# Patient Record
Sex: Female | Born: 1940 | Race: White | Hispanic: No | Marital: Married | Smoking: Never smoker
Health system: Southern US, Community
[De-identification: ages and names within clinical notes are randomized; demographics above are authoritative.]

## PROBLEM LIST (undated history)

## (undated) DIAGNOSIS — J189 Pneumonia, unspecified organism: Secondary | ICD-10-CM

## (undated) DIAGNOSIS — I10 Essential (primary) hypertension: Secondary | ICD-10-CM

## (undated) DIAGNOSIS — K219 Gastro-esophageal reflux disease without esophagitis: Secondary | ICD-10-CM

## (undated) DIAGNOSIS — E119 Type 2 diabetes mellitus without complications: Secondary | ICD-10-CM

## (undated) DIAGNOSIS — D649 Anemia, unspecified: Secondary | ICD-10-CM

## (undated) DIAGNOSIS — Z87440 Personal history of urinary (tract) infections: Secondary | ICD-10-CM

## (undated) DIAGNOSIS — Z9289 Personal history of other medical treatment: Secondary | ICD-10-CM

## (undated) DIAGNOSIS — E785 Hyperlipidemia, unspecified: Secondary | ICD-10-CM

## (undated) DIAGNOSIS — I509 Heart failure, unspecified: Secondary | ICD-10-CM

## (undated) HISTORY — DX: Gastro-esophageal reflux disease without esophagitis: K21.9

## (undated) HISTORY — DX: Pneumonia, unspecified organism: J18.9

## (undated) HISTORY — DX: Personal history of urinary (tract) infections: Z87.440

## (undated) HISTORY — DX: Personal history of other medical treatment: Z92.89

## (undated) HISTORY — PX: TONSILLECTOMY: SUR1361

---
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
## 2011-09-20 HISTORY — PX: CATARACT EXTRACTION, BILATERAL: SHX1313

## 2015-10-26 DIAGNOSIS — I2510 Atherosclerotic heart disease of native coronary artery without angina pectoris: Secondary | ICD-10-CM

## 2015-10-26 DIAGNOSIS — K219 Gastro-esophageal reflux disease without esophagitis: Secondary | ICD-10-CM

## 2015-10-26 HISTORY — DX: Gastro-esophageal reflux disease without esophagitis: K21.9

## 2015-10-26 HISTORY — DX: Atherosclerotic heart disease of native coronary artery without angina pectoris: I25.10

## 2017-06-23 DIAGNOSIS — I447 Left bundle-branch block, unspecified: Secondary | ICD-10-CM | POA: Insufficient documentation

## 2017-06-23 HISTORY — DX: Left bundle-branch block, unspecified: I44.7

## 2017-07-19 DIAGNOSIS — E1122 Type 2 diabetes mellitus with diabetic chronic kidney disease: Secondary | ICD-10-CM | POA: Insufficient documentation

## 2017-07-19 DIAGNOSIS — N183 Chronic kidney disease, stage 3 unspecified: Secondary | ICD-10-CM

## 2017-07-19 HISTORY — DX: Hypertensive chronic kidney disease with stage 1 through stage 4 chronic kidney disease, or unspecified chronic kidney disease: E11.22

## 2017-07-19 HISTORY — DX: Chronic kidney disease, stage 3 unspecified: N18.30

## 2017-09-26 DIAGNOSIS — G250 Essential tremor: Secondary | ICD-10-CM | POA: Insufficient documentation

## 2017-09-26 HISTORY — DX: Essential tremor: G25.0

## 2017-10-24 DIAGNOSIS — D329 Benign neoplasm of meninges, unspecified: Secondary | ICD-10-CM

## 2017-10-24 HISTORY — DX: Benign neoplasm of meninges, unspecified: D32.9

## 2020-07-25 ENCOUNTER — Emergency Department (HOSPITAL_COMMUNITY): Payer: Medicare Other

## 2020-07-25 ENCOUNTER — Emergency Department (HOSPITAL_COMMUNITY)
Admission: EM | Admit: 2020-07-25 | Discharge: 2020-07-25 | Disposition: A | Discharge status: Home / Self Care | Payer: Medicare Other | Attending: Emergency Medicine | Admitting: Emergency Medicine

## 2020-07-25 ENCOUNTER — Other Ambulatory Visit: Payer: Self-pay

## 2020-07-25 ENCOUNTER — Encounter (HOSPITAL_COMMUNITY): Payer: Self-pay

## 2020-07-25 DIAGNOSIS — N390 Urinary tract infection, site not specified: Secondary | ICD-10-CM | POA: Insufficient documentation

## 2020-07-25 DIAGNOSIS — F329 Major depressive disorder, single episode, unspecified: Secondary | ICD-10-CM | POA: Insufficient documentation

## 2020-07-25 DIAGNOSIS — I110 Hypertensive heart disease with heart failure: Secondary | ICD-10-CM | POA: Diagnosis not present

## 2020-07-25 DIAGNOSIS — E119 Type 2 diabetes mellitus without complications: Secondary | ICD-10-CM | POA: Diagnosis not present

## 2020-07-25 DIAGNOSIS — R1084 Generalized abdominal pain: Secondary | ICD-10-CM

## 2020-07-25 DIAGNOSIS — R000 Tachycardia, unspecified: Secondary | ICD-10-CM | POA: Insufficient documentation

## 2020-07-25 DIAGNOSIS — I509 Heart failure, unspecified: Secondary | ICD-10-CM | POA: Insufficient documentation

## 2020-07-25 DIAGNOSIS — K5641 Fecal impaction: Secondary | ICD-10-CM | POA: Diagnosis not present

## 2020-07-25 DIAGNOSIS — Z79899 Other long term (current) drug therapy: Secondary | ICD-10-CM | POA: Diagnosis not present

## 2020-07-25 DIAGNOSIS — Z20822 Contact with and (suspected) exposure to covid-19: Secondary | ICD-10-CM | POA: Diagnosis not present

## 2020-07-25 DIAGNOSIS — Z7982 Long term (current) use of aspirin: Secondary | ICD-10-CM | POA: Diagnosis not present

## 2020-07-25 DIAGNOSIS — Z7984 Long term (current) use of oral hypoglycemic drugs: Secondary | ICD-10-CM | POA: Diagnosis not present

## 2020-07-25 DIAGNOSIS — K5900 Constipation, unspecified: Secondary | ICD-10-CM | POA: Diagnosis present

## 2020-07-25 HISTORY — DX: Hyperlipidemia, unspecified: E78.5

## 2020-07-25 HISTORY — DX: Essential (primary) hypertension: I10

## 2020-07-25 HISTORY — DX: Anemia, unspecified: D64.9

## 2020-07-25 HISTORY — DX: Type 2 diabetes mellitus without complications: E11.9

## 2020-07-25 HISTORY — DX: Gastro-esophageal reflux disease without esophagitis: K21.9

## 2020-07-25 LAB — CBC WITH DIFFERENTIAL/PLATELET
Abs Immature Granulocytes: 0.11 10*3/uL — ABNORMAL HIGH (ref 0.00–0.07)
Basophils Absolute: 0.1 10*3/uL (ref 0.0–0.1)
Basophils Relative: 0 %
Eosinophils Absolute: 0 10*3/uL (ref 0.0–0.5)
Eosinophils Relative: 0 %
HCT: 42 % (ref 36.0–46.0)
Hemoglobin: 13.7 g/dL (ref 12.0–15.0)
Immature Granulocytes: 1 %
Lymphocytes Relative: 9 %
Lymphs Abs: 1.5 10*3/uL (ref 0.7–4.0)
MCH: 30.5 pg (ref 26.0–34.0)
MCHC: 32.6 g/dL (ref 30.0–36.0)
MCV: 93.5 fL (ref 80.0–100.0)
Monocytes Absolute: 0.8 10*3/uL (ref 0.1–1.0)
Monocytes Relative: 5 %
Neutro Abs: 14.7 10*3/uL — ABNORMAL HIGH (ref 1.7–7.7)
Neutrophils Relative %: 85 %
Platelets: 320 10*3/uL (ref 150–400)
RBC: 4.49 MIL/uL (ref 3.87–5.11)
RDW: 12.8 % (ref 11.5–15.5)
WBC: 17.3 10*3/uL — ABNORMAL HIGH (ref 4.0–10.5)
nRBC: 0 % (ref 0.0–0.2)

## 2020-07-25 LAB — URINALYSIS, ROUTINE W REFLEX MICROSCOPIC
Bilirubin Urine: NEGATIVE
Glucose, UA: >=500 mg/dL — AB
Hgb urine dipstick: NEGATIVE
Ketones, ur: 5 mg/dL — AB
Nitrite: POSITIVE — AB
Protein, ur: 30 mg/dL — AB
Specific Gravity, Urine: 1.015 (ref 1.005–1.030)
WBC, UA: >50 WBC/hpf — ABNORMAL HIGH (ref 0–5)
pH: 5 (ref 5.0–8.0)

## 2020-07-25 LAB — RESP PANEL BY RT PCR (RSV, FLU A&B, COVID)
Influenza A by PCR: NEGATIVE
Influenza B by PCR: NEGATIVE
Respiratory Syncytial Virus by PCR: NEGATIVE
SARS Coronavirus 2 by RT PCR: NEGATIVE

## 2020-07-25 LAB — COMPREHENSIVE METABOLIC PANEL
ALT: 20 U/L (ref 0–44)
AST: 20 U/L (ref 15–41)
Albumin: 4 g/dL (ref 3.5–5.0)
Alkaline Phosphatase: 75 U/L (ref 38–126)
Anion gap: 13 (ref 5–15)
BUN: 23 mg/dL (ref 8–23)
CO2: 20 mmol/L — ABNORMAL LOW (ref 22–32)
Calcium: 9.7 mg/dL (ref 8.9–10.3)
Chloride: 101 mmol/L (ref 98–111)
Creatinine, Ser: 1.24 mg/dL — ABNORMAL HIGH (ref 0.44–1.00)
GFR, Estimated: 45 mL/min — ABNORMAL LOW (ref >60)
Glucose, Bld: 381 mg/dL — ABNORMAL HIGH (ref 70–99)
Potassium: 5.1 mmol/L (ref 3.5–5.1)
Sodium: 134 mmol/L — ABNORMAL LOW (ref 135–145)
Total Bilirubin: 0.8 mg/dL (ref 0.3–1.2)
Total Protein: 8.1 g/dL (ref 6.5–8.1)

## 2020-07-25 LAB — LIPASE, BLOOD: Lipase: 90 U/L — ABNORMAL HIGH (ref 11–51)

## 2020-07-25 LAB — BRAIN NATRIURETIC PEPTIDE: B Natriuretic Peptide: 61 pg/mL (ref 0.0–100.0)

## 2020-07-25 MED ORDER — CEPHALEXIN 500 MG PO CAPS: 500.0000 mg | ORAL_CAPSULE | Freq: Two times a day (BID) | ORAL | 0 refills | Status: AC

## 2020-07-25 MED ORDER — SENNOSIDES-DOCUSATE SODIUM 8.6-50 MG PO TABS: 2.0000 | ORAL_TABLET | Freq: Every day | ORAL | 0 refills | Status: AC

## 2020-07-25 MED ORDER — MAGNESIUM CITRATE PO SOLN: 1.0000 | Freq: Once | ORAL | 5 refills | Status: AC

## 2020-07-25 MED ORDER — CEPHALEXIN 500 MG PO CAPS: 500.0000 mg | ORAL_CAPSULE | Freq: Two times a day (BID) | ORAL | 0 refills | Status: DC

## 2020-07-25 MED ORDER — SENNOSIDES-DOCUSATE SODIUM 8.6-50 MG PO TABS: 2.0000 | ORAL_TABLET | Freq: Every day | ORAL | 0 refills | Status: DC

## 2020-07-25 MED ORDER — SODIUM CHLORIDE 0.9 % IV SOLN
1.0000 g | Freq: Once | INTRAVENOUS | Status: AC
  Administered 2020-07-25: 1 g via INTRAVENOUS
  Filled 2020-07-25: qty 10

## 2020-07-25 MED ORDER — ACETAMINOPHEN 500 MG PO TABS
1000.0000 mg | ORAL_TABLET | Freq: Once | ORAL | Status: AC
  Administered 2020-07-25: 1000 mg via ORAL
  Filled 2020-07-25: qty 2

## 2020-07-25 MED ORDER — MAGNESIUM CITRATE PO SOLN: 1.0000 | Freq: Once | ORAL | 5 refills | Status: DC

## 2020-07-25 MED ORDER — FENTANYL CITRATE (PF) 100 MCG/2ML IJ SOLN
25.0000 ug | Freq: Once | INTRAMUSCULAR | Status: AC
  Administered 2020-07-25: 25 ug via INTRAVENOUS
  Filled 2020-07-25: qty 2

## 2020-07-25 MED ORDER — IOHEXOL 300 MG/ML  SOLN
80.0000 mL | Freq: Once | INTRAMUSCULAR | Status: AC | PRN
  Administered 2020-07-25: 80 mL via INTRAVENOUS

## 2020-07-25 MED ORDER — SODIUM CHLORIDE 0.9 % IV SOLN: INTRAVENOUS | Status: DC

## 2020-07-25 NOTE — Discharge Instructions (Signed)
As discussed, your evaluation today has been largely reassuring.  But, it is important that you monitor your condition carefully, and do not hesitate to return to the ED if you develop new, or concerning changes in your condition. ? ?Otherwise, please follow-up with your physician for appropriate ongoing care. ? ?

## 2020-07-25 NOTE — ED Triage Notes (Signed)
Pt arrived via walk in, c/o constipation and diffuse abd pain x4 days. Denies any vomiting.   Took miralax x1 at home with no relief.

## 2020-07-25 NOTE — ED Notes (Signed)
Pt. Wishes to stay on chair due to nausea. Pt doesn't want to change into a gown.

## 2020-07-25 NOTE — ED Provider Notes (Signed)
[facility] DEPT Provider Note   CSN: [id number] Arrival date & time: 07/25/20  1559     History Chief Complaint  Patient presents with  . Constipation    Elke Holtry is a 79 y.o. female.  HPI Patient presents possibly with concern for constipation, abdominal pain.  Patient is a poor historian, when asked specific questions about how long she has had symptoms she responds" I do not know".  She has a similar answer to whether she has had similar episodes, when her last bowel movement was, if there are other ongoing concerns.  It seems as though patient has been symptomatic for a few days, with increasing, intermittent abdominal pain that is worse with sitting upright.  Does not seem as though she has had vomiting.  Patient seemingly denies abdominal surgery.  She notes that she is" going through a lot" but does not expand on what specifically she is referring to.  Additional history obtained on chart review is clear the patient goes to [facility] for the majority of her care, has had episodes with fecal incontinence, urinary incontinence within the past year.  Attempts at additional chart review from [facility] do not include visible EKG.  Patient does seemingly have history of sepsis, earlier in the year.  Past Medical History:  Diagnosis Date  . Anemia   . CHF (congestive heart failure) ([place])   . Diabetes mellitus without complication ([place])   . GERD (gastroesophageal reflux disease)   . Hyperlipidemia   . Hypertension     There are no problems to display for this patient.   History reviewed. No pertinent surgical history.   OB History   No obstetric history on file.     History reviewed. No pertinent family history.  Social History   Tobacco Use  . Smoking status: Never Smoker  . Smokeless tobacco: Never Used  Substance Use Topics  . Alcohol use: Not on file  . Drug use: Not on file    Home Medications Prior to Admission  medications   Medication Sig Start Date End Date Taking? Authorizing Provider  aspirin 325 MG tablet Take 325 mg by mouth daily.    Yes [provider]  atorvastatin (LIPITOR) 20 MG tablet Take 20 mg by mouth daily.  05/08/17  Yes [provider]  DETROL LA 4 MG 24 hr capsule Take 4 mg by mouth daily. 05/25/20  Yes [provider]  glimepiride (AMARYL) 4 MG tablet Take 8 mg by mouth daily with breakfast.  01/14/20  Yes [provider]  lisinopril (ZESTRIL) 10 MG tablet Take 10 mg by mouth daily. 04/27/20  Yes [provider]  Metamucil Fiber CHEW Chew 2 tablets by mouth daily.   Yes [provider]  polyethylene glycol (MIRALAX / GLYCOLAX) 17 g packet Take 17 g by mouth daily as needed for moderate constipation.   Yes [provider]    Allergies    Penicillins  Review of Systems   Review of Systems  Unable to perform ROS: Other  Confusion, patient answers" I do not know" to essentially all questions, including duration of her symptoms, as well as other ongoing complaints. Physical Exam Updated Vital Signs BP (!) 143/64 (BP Location: Right Arm)   Pulse (!) 108   Temp 97.6 F (36.4 C) (Oral)   Resp 19   Ht 5\' 2"  (1.575 m)   Wt 99.8 kg   SpO2 94%   BMI 40.24 kg/m   Physical  Exam Vitals and nursing note reviewed.  Constitutional:      Appearance: She is well-developed. She is obese. She is ill-appearing.  HENT:     Head: Normocephalic and atraumatic.  Eyes:     Conjunctiva/sclera: Conjunctivae normal.  Cardiovascular:     Rate and Rhythm: Regular rhythm. Tachycardia present.  Pulmonary:     Effort: Pulmonary effort is normal. No respiratory distress.     Breath sounds: Normal breath sounds. No stridor.  Abdominal:     General: There is no distension.     Tenderness: There is abdominal tenderness.  Skin:    General: Skin is warm and dry.  Neurological:     Mental Status: She is alert and oriented to person, place,  and time.     Cranial Nerves: No cranial nerve deficit.  Psychiatric:        Mood and Affect: Mood is depressed.        Behavior: Behavior is slowed and withdrawn.        Cognition and Memory: Cognition is impaired.     ED Results / Procedures / Treatments   Labs (all labs ordered are listed, but only abnormal results are displayed) Labs Reviewed  COMPREHENSIVE METABOLIC PANEL - Abnormal; Notable for the following components:      Result Value   Sodium 134 (*)    CO2 20 (*)    Glucose, Bld 381 (*)    Creatinine, Ser 1.24 (*)    GFR, Estimated 45 (*)    All other components within normal limits  LIPASE, BLOOD - Abnormal; Notable for the following components:   Lipase 90 (*)    All other components within normal limits  CBC WITH DIFFERENTIAL/PLATELET - Abnormal; Notable for the following components:   WBC 17.3 (*)    Neutro Abs 14.7 (*)    Abs Immature Granulocytes 0.11 (*)    All other components within normal limits  URINALYSIS, ROUTINE W REFLEX MICROSCOPIC - Abnormal; Notable for the following components:   APPearance HAZY (*)    Glucose, UA >=500 (*)    Ketones, ur 5 (*)    Protein, ur 30 (*)    Nitrite POSITIVE (*)    Leukocytes,Ua LARGE (*)    WBC, UA >50 (*)    Bacteria, UA RARE (*)    All other components within normal limits  RESP PANEL BY RT PCR (RSV, FLU A&B, COVID)  BRAIN NATRIURETIC PEPTIDE    EKG EKG Interpretation  Date/Time:  Saturday July 25 2020 17:00:53 EDT Ventricular Rate:  121 PR Interval:    QRS Duration: 120 QT Interval:  319 QTC Calculation: 453 R Axis:   -50 Text Interpretation: Sinus tachycardia Nonspecific IVCD with LAD Isttw Abnormal ECG Confirmed by Carmin Muskrat [phone]) on 07/25/2020 5:07:02 PM  Heart rate monitor sinus tach, 120, abnormal   Radiology CT ABDOMEN PELVIS W CONTRAST  Result Date: 07/25/2020 CLINICAL DATA:  Abdominal distension.  Diverticulitis suspected. EXAM: CT ABDOMEN AND PELVIS WITH CONTRAST TECHNIQUE:  Multidetector CT imaging of the abdomen and pelvis was performed using the standard protocol following bolus administration of intravenous contrast. CONTRAST:  75mL OMNIPAQUE IOHEXOL 300 MG/ML  SOLN COMPARISON:  CT 06/22/2017 FINDINGS: Lower chest: Minimal scarring in the periphery of the left lower lobe. Heart is upper normal in size. Mild wall thickening of the distal esophagus. Hepatobiliary: Stable 3.7 cm cyst in the left lobe of the liver. Smaller low-density lesion in the right lobe is too small to accurately characterize. Background hepatic  steatosis. Layering gallstones without pericholecystic inflammation. There is no biliary dilatation. Pancreas: No ductal dilatation or inflammation. Spleen: Normal in size without focal abnormality. Adrenals/Urinary Tract: Normal adrenal glands. No hydronephrosis. Mild chronic symmetric perinephric edema. No evidence of renal calculi. Homogeneous enhancement with symmetric excretion on delayed phase imaging. Subcentimeter low-density lesion in the right kidney not significantly changed from prior exam, too small to characterize but likely cyst. The urinary bladder is unremarkable. Stomach/Bowel: Mild distal esophageal wall thickening. The stomach is decompressed. There is no small bowel obstruction or inflammatory change. High-riding cecum in the right mid abdomen. The appendix is not confidently visualized, there is no appendicitis. Moderate stool in the right colon. Small volume of stool in the descending colon. Descending and sigmoid colonic diverticulosis. No diverticulitis. Stool ball distends the rectum with mild rectal wall thickening and perirectal fat stranding. Rectal distention of 6.5 cm. No bowel pneumatosis. Vascular/Lymphatic: Aortic atherosclerosis. No aortic aneurysm. Patent portal vein. No abdominopelvic adenopathy. Reproductive: Low-density in the endometrium may represent endometrial thickening or fluid, measuring 7 mm. There is no adnexal mass. Other:  No free air, free fluid, or intra-abdominal fluid collection. Tiny fat containing umbilical hernia. 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 Degenerative disc disease at T11-T12 affecting phenomenon and prominent Schmorl's node involving superior endplate of I20. There are no acute or suspicious osseous abnormalities. IMPRESSION: 1. Stool ball distends the rectum with mild rectal wall thickening and perirectal fat stranding, suspicious for fecal impaction. No perforation or bowel ischemia. 2. Low-density in the endometrium may represent endometrial thickening or fluid, measuring 7 mm. Recommend nonemergent pelvic ultrasound for characterization on an elective basis. 3. Colonic diverticulosis without diverticulitis. 4. Hepatic steatosis. Cholelithiasis without gallbladder inflammation. 5. Mild distal esophageal wall thickening, can be seen with reflux or esophagitis. Aortic Atherosclerosis (ICD10-I70.0). Electronically Signed   By: Keith Rake M.D.   On: 07/25/2020 18:50    Procedures Fecal disimpaction  Date/Time: 07/25/2020 10:09 PM Performed by: Carmin Muskrat, MD Authorized by: Carmin Muskrat, MD  Preparation: Patient was prepped and draped in the usual sterile fashion. Local anesthesia used: no  Anesthesia: Local anesthesia used: no  Sedation: Patient sedated: no  Patient tolerance: patient tolerated the procedure well with no immediate complications Comments: After fentanyl bolus the patient tolerated disimpaction with substantial amounts of stool removed from her rectum.    (including critical care time)  Medications Ordered in ED Medications  0.9 %  sodium chloride infusion ( Intravenous New Bag/Given (Non-Interop) 07/25/20 1704)  iohexol (OMNIPAQUE) 300 MG/ML solution 80 mL (80 mLs Intravenous Contrast Given 07/25/20 1808)  fentaNYL (SUBLIMAZE) injection 25 mcg (25 mcg Intravenous Given 07/25/20 2100)  acetaminophen (TYLENOL) tablet 1,000  mg (1,000 mg Oral Given 07/25/20 2115)  cefTRIAXone (ROCEPHIN) 1 g in sodium chloride 0.9 % 100 mL IVPB (0 g Intravenous Stopped 07/25/20 2154)    ED Course  I have reviewed the triage vital signs and the nursing notes.  Pertinent labs & imaging results that were available during my care of the patient were reviewed by me and considered in my medical decision making (see chart for details).  Update:, Patient accompanied by her husband.  On we discussed findings thus far including CT, labs, urinalysis.  Findings notable for CT suggesting fecal impaction, without obvious obstruction.  Urinalysis suggest infection, labs notable for mild dehydration, hyperkalemia and hyperglycemia.  Patient amenable to disimpaction. 10:08 PM Patient has completed ceftriaxone, has received Tylenol, fever has resolved, heart rate has improved substantially, she is  feeling substantially better, patient appropriate for discharge with outpatient follow-up and ongoing bowel habit regimen. Final Clinical Impression(s) / ED Diagnoses Final diagnoses:  Generalized abdominal pain  Fecal impaction in rectum ([place])  Lower urinary tract infectious disease   MDM Number of Diagnoses or Management Options Fecal impaction in rectum [facility]): new, needed workup Generalized abdominal pain: new, needed workup Lower urinary tract infectious disease: new, needed workup   Amount and/or Complexity of Data Reviewed Clinical lab tests: reviewed Tests in the radiology section of CPT: reviewed Tests in the medicine section of CPT: reviewed Decide to obtain previous medical records or to obtain history from someone other than the patient: yes Obtain history from someone other than the patient: yes Review and summarize past medical records: yes Independent visualization of images, tracings, or specimens: yes  Risk of Complications, Morbidity, and/or Mortality Presenting problems: high Diagnostic procedures: high Management options:  high  Critical Care Total time providing critical care: < 30 minutes  Patient Progress Patient progress: stable  Rx / DC Orders ED Discharge Orders         Ordered    magnesium citrate SOLN   Once        07/25/20 2211    senna-docusate (SENOKOT-S) 8.6-50 MG tablet  Daily        07/25/20 2211    cephALEXin (KEFLEX) 500 MG capsule  2 times daily        07/25/20 2211           Carmin Muskrat, MD 07/25/20 2212

## 2020-11-12 ENCOUNTER — Inpatient Hospital Stay (HOSPITAL_COMMUNITY)
Admission: EM | Admit: 2020-11-12 | Discharge: 2020-11-18 | DRG: 871 | Disposition: A | Discharge status: Home Health | Payer: Medicare Other | Attending: Family Medicine | Admitting: Family Medicine

## 2020-11-12 ENCOUNTER — Encounter (HOSPITAL_COMMUNITY): Payer: Self-pay | Admitting: Emergency Medicine

## 2020-11-12 DIAGNOSIS — K219 Gastro-esophageal reflux disease without esophagitis: Secondary | ICD-10-CM

## 2020-11-12 DIAGNOSIS — D320 Benign neoplasm of cerebral meninges: Secondary | ICD-10-CM | POA: Diagnosis present

## 2020-11-12 DIAGNOSIS — E1122 Type 2 diabetes mellitus with diabetic chronic kidney disease: Secondary | ICD-10-CM | POA: Diagnosis present

## 2020-11-12 DIAGNOSIS — Z880 Allergy status to penicillin: Secondary | ICD-10-CM

## 2020-11-12 DIAGNOSIS — A4150 Gram-negative sepsis, unspecified: Secondary | ICD-10-CM | POA: Diagnosis not present

## 2020-11-12 DIAGNOSIS — T464X6A Underdosing of angiotensin-converting-enzyme inhibitors, initial encounter: Secondary | ICD-10-CM | POA: Diagnosis present

## 2020-11-12 DIAGNOSIS — Z87440 Personal history of urinary (tract) infections: Secondary | ICD-10-CM

## 2020-11-12 DIAGNOSIS — Z6841 Body Mass Index (BMI) 40.0 and over, adult: Secondary | ICD-10-CM

## 2020-11-12 DIAGNOSIS — E872 Acidosis, unspecified: Secondary | ICD-10-CM | POA: Diagnosis present

## 2020-11-12 DIAGNOSIS — Z993 Dependence on wheelchair: Secondary | ICD-10-CM

## 2020-11-12 DIAGNOSIS — N390 Urinary tract infection, site not specified: Secondary | ICD-10-CM | POA: Diagnosis present

## 2020-11-12 DIAGNOSIS — Z20822 Contact with and (suspected) exposure to covid-19: Secondary | ICD-10-CM | POA: Diagnosis present

## 2020-11-12 DIAGNOSIS — I2510 Atherosclerotic heart disease of native coronary artery without angina pectoris: Secondary | ICD-10-CM

## 2020-11-12 DIAGNOSIS — N179 Acute kidney failure, unspecified: Secondary | ICD-10-CM

## 2020-11-12 DIAGNOSIS — E860 Dehydration: Secondary | ICD-10-CM | POA: Diagnosis present

## 2020-11-12 DIAGNOSIS — R739 Hyperglycemia, unspecified: Secondary | ICD-10-CM

## 2020-11-12 DIAGNOSIS — R6520 Severe sepsis without septic shock: Secondary | ICD-10-CM | POA: Diagnosis present

## 2020-11-12 DIAGNOSIS — F0390 Unspecified dementia without behavioral disturbance: Secondary | ICD-10-CM | POA: Diagnosis present

## 2020-11-12 DIAGNOSIS — I21A1 Myocardial infarction type 2: Secondary | ICD-10-CM | POA: Diagnosis present

## 2020-11-12 DIAGNOSIS — T443X6A Underdosing of other parasympatholytics [anticholinergics and antimuscarinics] and spasmolytics, initial encounter: Secondary | ICD-10-CM | POA: Diagnosis present

## 2020-11-12 DIAGNOSIS — E876 Hypokalemia: Secondary | ICD-10-CM | POA: Diagnosis present

## 2020-11-12 DIAGNOSIS — E782 Mixed hyperlipidemia: Secondary | ICD-10-CM | POA: Diagnosis present

## 2020-11-12 DIAGNOSIS — I472 Ventricular tachycardia: Secondary | ICD-10-CM

## 2020-11-12 DIAGNOSIS — R6510 Systemic inflammatory response syndrome (SIRS) of non-infectious origin without acute organ dysfunction: Secondary | ICD-10-CM | POA: Diagnosis not present

## 2020-11-12 DIAGNOSIS — G928 Other toxic encephalopathy: Secondary | ICD-10-CM | POA: Diagnosis present

## 2020-11-12 DIAGNOSIS — R000 Tachycardia, unspecified: Secondary | ICD-10-CM

## 2020-11-12 DIAGNOSIS — E1169 Type 2 diabetes mellitus with other specified complication: Secondary | ICD-10-CM | POA: Diagnosis present

## 2020-11-12 DIAGNOSIS — Z91138 Patient's unintentional underdosing of medication regimen for other reason: Secondary | ICD-10-CM

## 2020-11-12 DIAGNOSIS — K56609 Unspecified intestinal obstruction, unspecified as to partial versus complete obstruction: Secondary | ICD-10-CM

## 2020-11-12 DIAGNOSIS — L89316 Pressure-induced deep tissue damage of right buttock: Secondary | ICD-10-CM | POA: Diagnosis present

## 2020-11-12 DIAGNOSIS — L89326 Pressure-induced deep tissue damage of left buttock: Secondary | ICD-10-CM | POA: Diagnosis present

## 2020-11-12 DIAGNOSIS — K8020 Calculus of gallbladder without cholecystitis without obstruction: Secondary | ICD-10-CM | POA: Diagnosis present

## 2020-11-12 DIAGNOSIS — Z79899 Other long term (current) drug therapy: Secondary | ICD-10-CM

## 2020-11-12 DIAGNOSIS — Z955 Presence of coronary angioplasty implant and graft: Secondary | ICD-10-CM

## 2020-11-12 DIAGNOSIS — I471 Supraventricular tachycardia: Secondary | ICD-10-CM | POA: Diagnosis present

## 2020-11-12 DIAGNOSIS — F22 Delusional disorders: Secondary | ICD-10-CM

## 2020-11-12 DIAGNOSIS — I129 Hypertensive chronic kidney disease with stage 1 through stage 4 chronic kidney disease, or unspecified chronic kidney disease: Secondary | ICD-10-CM | POA: Diagnosis present

## 2020-11-12 DIAGNOSIS — R079 Chest pain, unspecified: Secondary | ICD-10-CM

## 2020-11-12 DIAGNOSIS — N184 Chronic kidney disease, stage 4 (severe): Secondary | ICD-10-CM | POA: Diagnosis present

## 2020-11-12 DIAGNOSIS — Z888 Allergy status to other drugs, medicaments and biological substances status: Secondary | ICD-10-CM

## 2020-11-12 DIAGNOSIS — T466X6A Underdosing of antihyperlipidemic and antiarteriosclerotic drugs, initial encounter: Secondary | ICD-10-CM | POA: Diagnosis present

## 2020-11-12 DIAGNOSIS — T383X6A Underdosing of insulin and oral hypoglycemic [antidiabetic] drugs, initial encounter: Secondary | ICD-10-CM | POA: Diagnosis present

## 2020-11-12 DIAGNOSIS — Z8679 Personal history of other diseases of the circulatory system: Secondary | ICD-10-CM

## 2020-11-12 DIAGNOSIS — I10 Essential (primary) hypertension: Secondary | ICD-10-CM | POA: Diagnosis present

## 2020-11-12 DIAGNOSIS — A419 Sepsis, unspecified organism: Secondary | ICD-10-CM

## 2020-11-12 DIAGNOSIS — E871 Hypo-osmolality and hyponatremia: Secondary | ICD-10-CM | POA: Diagnosis present

## 2020-11-12 DIAGNOSIS — E1165 Type 2 diabetes mellitus with hyperglycemia: Secondary | ICD-10-CM | POA: Diagnosis present

## 2020-11-12 DIAGNOSIS — I447 Left bundle-branch block, unspecified: Secondary | ICD-10-CM | POA: Diagnosis present

## 2020-11-12 LAB — CBC
HCT: 41.6 % (ref 36.0–46.0)
Hemoglobin: 13.5 g/dL (ref 12.0–15.0)
MCH: 29.7 pg (ref 26.0–34.0)
MCHC: 32.5 g/dL (ref 30.0–36.0)
MCV: 91.6 fL (ref 80.0–100.0)
Platelets: 247 10*3/uL (ref 150–400)
RBC: 4.54 MIL/uL (ref 3.87–5.11)
RDW: 12.4 % (ref 11.5–15.5)
WBC: 8.3 10*3/uL (ref 4.0–10.5)
nRBC: 0 % (ref 0.0–0.2)

## 2020-11-12 LAB — BASIC METABOLIC PANEL
Anion gap: 11 (ref 5–15)
BUN: 16 mg/dL (ref 8–23)
CO2: 23 mmol/L (ref 22–32)
Calcium: 9.5 mg/dL (ref 8.9–10.3)
Chloride: 99 mmol/L (ref 98–111)
Creatinine, Ser: 1.26 mg/dL — ABNORMAL HIGH (ref 0.44–1.00)
GFR, Estimated: 44 mL/min — ABNORMAL LOW (ref >60)
Glucose, Bld: 400 mg/dL — ABNORMAL HIGH (ref 70–99)
Potassium: 4 mmol/L (ref 3.5–5.1)
Sodium: 133 mmol/L — ABNORMAL LOW (ref 135–145)

## 2020-11-12 LAB — I-STAT VENOUS BLOOD GAS, ED
Acid-Base Excess: 1 mmol/L (ref 0.0–2.0)
Bicarbonate: 23.6 mmol/L (ref 20.0–28.0)
Calcium, Ion: 1.16 mmol/L (ref 1.15–1.40)
HCT: 40 % (ref 36.0–46.0)
Hemoglobin: 13.6 g/dL (ref 12.0–15.0)
O2 Saturation: 61 %
Potassium: 4.3 mmol/L (ref 3.5–5.1)
Sodium: 134 mmol/L — ABNORMAL LOW (ref 135–145)
TCO2: 25 mmol/L (ref 22–32)
pCO2, Ven: 30 mmHg — ABNORMAL LOW (ref 44.0–60.0)
pH, Ven: 7.503 — ABNORMAL HIGH (ref 7.250–7.430)
pO2, Ven: 28 mmHg — CL (ref 32.0–45.0)

## 2020-11-12 LAB — CBG MONITORING, ED
Glucose-Capillary: 305 mg/dL — ABNORMAL HIGH (ref 70–99)
Glucose-Capillary: 445 mg/dL — ABNORMAL HIGH (ref 70–99)

## 2020-11-12 LAB — URINALYSIS, ROUTINE W REFLEX MICROSCOPIC
Bilirubin Urine: NEGATIVE
Glucose, UA: >=500 mg/dL — AB
Hgb urine dipstick: NEGATIVE
Ketones, ur: 5 mg/dL — AB
Nitrite: NEGATIVE
Protein, ur: NEGATIVE mg/dL
Specific Gravity, Urine: 1.01 (ref 1.005–1.030)
pH: 7 (ref 5.0–8.0)

## 2020-11-12 MED ORDER — INSULIN ASPART 100 UNIT/ML ~~LOC~~ SOLN
0.0000 [IU] | Freq: Three times a day (TID) | SUBCUTANEOUS | Status: DC
  Administered 2020-11-14 (×2): 11 [IU] via SUBCUTANEOUS
  Administered 2020-11-14: 5 [IU] via SUBCUTANEOUS

## 2020-11-12 MED ORDER — LACTATED RINGERS IV BOLUS
1000.0000 mL | Freq: Once | INTRAVENOUS | Status: AC
  Administered 2020-11-12: 1000 mL via INTRAVENOUS

## 2020-11-12 MED ORDER — LISINOPRIL 10 MG PO TABS
10.0000 mg | ORAL_TABLET | Freq: Every day | ORAL | Status: DC
  Administered 2020-11-13: 10 mg via ORAL
  Filled 2020-11-12: qty 1

## 2020-11-12 MED ORDER — ATORVASTATIN CALCIUM 10 MG PO TABS
20.0000 mg | ORAL_TABLET | Freq: Every day | ORAL | Status: DC
  Administered 2020-11-13 – 2020-11-18 (×6): 20 mg via ORAL
  Filled 2020-11-12 (×6): qty 2

## 2020-11-12 NOTE — ED Provider Notes (Signed)
[facility] EMERGENCY DEPARTMENT Provider Note   CSN: [id number] Arrival date & time: 11/12/20  1754     History Chief Complaint  Patient presents with  . Weakness    Stefanie Houston is a 80 y.o. female w/ h/o CHF, T2DM, GERD, HLD, and HTN who presents to the ED via EMS form home with husband for hyperglycemia and psychiatric evaluation. EMS called to home today per county serviceman who was visiting the home for husband's concerns of psychiatric problem. Over the last several months, patient refusing to see her doctors or take medicine for her diabetes or other chronic conditions because she believes they are members of a cult and are trying to control her. Patient also believes her daughter is a member of a cult as well when she told the patient that she needed to see her doctor. Husband denies any SI or HI and feels safe with her at home, but is concerned because her delusions are interfering with treatment for her chronic health conditions. He wanted patient to come to ED for psychiatric evaluation. Currently not on any psychiatric medications nor is seen by counselor/psychiatrist. Husband also worried about UTI given her long history of urinary incontinence and previous UTI. Patient states she feels well and no complaints at this time.  The history is provided by the patient, medical records and the spouse.  Mental Health Problem Presenting symptoms: delusional   Presenting symptoms: no hallucinations, no self-mutilation and no suicidal thoughts   Patient accompanied by:  Caregiver (EMS) Degree of incapacity (severity):  Moderate Onset quality:  Gradual Duration: several months. Timing:  Constant Progression:  Unchanged Chronicity:  New Treatment compliance:  Untreated Relieved by:  Nothing Worsened by:  Family interactions (interactions with her doctors and daughter) Ineffective treatments:  None tried Associated symptoms: no abdominal pain, no chest pain and no  headaches   Risk factors: hx of mental illness   Risk factors: no hx of suicide attempts        Past Medical History:  Diagnosis Date  . Anemia   . CHF (congestive heart failure) ([place])   . Diabetes mellitus without complication ([place])   . GERD (gastroesophageal reflux disease)   . Hyperlipidemia   . Hypertension     There are no problems to display for this patient.   History reviewed. No pertinent surgical history.   OB History   No obstetric history on file.     No family history on file.  Social History   Tobacco Use  . Smoking status: Never Smoker  . Smokeless tobacco: Never Used    Home Medications Prior to Admission medications   Medication Sig Start Date End Date Taking? Authorizing Provider  aspirin 325 MG tablet Take 325 mg by mouth daily.     [provider]  atorvastatin (LIPITOR) 20 MG tablet Take 20 mg by mouth daily.  05/08/17   [provider]  DETROL LA 4 MG 24 hr capsule Take 4 mg by mouth daily. 05/25/20   [provider]  glimepiride (AMARYL) 4 MG tablet Take 8 mg by mouth daily with breakfast.  01/14/20   [provider]  lisinopril (ZESTRIL) 10 MG tablet Take 10 mg by mouth daily. 04/27/20   [provider]  Metamucil Fiber CHEW Chew 2 tablets by mouth daily.    [provider]  polyethylene glycol (MIRALAX / GLYCOLAX) 17 g packet Take 17 g by mouth daily as needed for moderate constipation.    [provider]    Allergies    Penicillins  Review of Systems   Review of Systems  Constitutional: Negative for chills and fever.  HENT: Negative for ear pain and sore throat.   Eyes: Negative for pain and visual disturbance.  Respiratory: Negative for cough and shortness of breath.   Cardiovascular: Negative for chest pain and palpitations.  Gastrointestinal: Negative for abdominal pain and vomiting.  Genitourinary: Negative for dysuria and hematuria.  Musculoskeletal: Negative for  arthralgias and back pain.  Skin: Negative for color change and rash.  Neurological: Negative for seizures, syncope and headaches.  Psychiatric/Behavioral: Negative for hallucinations, self-injury and suicidal ideas.  All other systems reviewed and are negative.   Physical Exam Updated Vital Signs BP (!) 157/71   Pulse 82   Temp 98.7 F (37.1 C) (Oral)   Resp 13   Ht 5\' 2"  (1.575 m)   Wt 99.8 kg   SpO2 100%   BMI 40.24 kg/m   Physical Exam Vitals and nursing note reviewed.  Constitutional:      General: She is not in acute distress.    Appearance: Normal appearance. She is well-developed and well-nourished. She is not ill-appearing.  HENT:     Head: Normocephalic and atraumatic.     Right Ear: External ear normal.     Left Ear: External ear normal.     Nose: Nose normal.     Mouth/Throat:     Mouth: Mucous membranes are moist.     Pharynx: Oropharynx is clear. No oropharyngeal exudate or posterior oropharyngeal erythema.  Eyes:     General: No scleral icterus.       Right eye: No discharge.        Left eye: No discharge.     Conjunctiva/sclera: Conjunctivae normal.  Cardiovascular:     Rate and Rhythm: Normal rate and regular rhythm.     Pulses: Normal pulses.     Heart sounds: Normal heart sounds. No murmur heard.   Pulmonary:     Effort: Pulmonary effort is normal. No respiratory distress.     Breath sounds: Normal breath sounds. No wheezing, rhonchi or rales.  Abdominal:     General: Abdomen is flat. There is no distension.     Palpations: Abdomen is soft.     Tenderness: There is no abdominal tenderness. There is no guarding or rebound.  Musculoskeletal:        General: No edema.     Cervical back: Neck supple.     Right lower leg: No edema.     Left lower leg: No edema.  Skin:    General: Skin is warm and dry.     Findings: No rash.  Neurological:     General: No focal deficit present.     Mental Status: She is alert and oriented to person, place, and  time.     Motor: No weakness.  Psychiatric:        Attention and Perception: Attention normal.        Mood and Affect: Mood and affect normal.        Speech: Speech normal.        Behavior: Behavior normal. Behavior is cooperative.        Thought Content: Thought content is paranoid and delusional. Thought content does not include homicidal or suicidal ideation. Thought content does not include homicidal or suicidal plan.     ED Results / Procedures / Treatments   Labs (all labs ordered are listed, but  only abnormal results are displayed) Labs Reviewed  BASIC METABOLIC PANEL - Abnormal; Notable for the following components:      Result Value   Sodium 133 (*)    Glucose, Bld 400 (*)    Creatinine, Ser 1.26 (*)    GFR, Estimated 44 (*)    All other components within normal limits  URINALYSIS, ROUTINE W REFLEX MICROSCOPIC - Abnormal; Notable for the following components:   Color, Urine STRAW (*)    Glucose, UA >=500 (*)    Ketones, ur 5 (*)    Leukocytes,Ua TRACE (*)    Bacteria, UA FEW (*)    All other components within normal limits  CBG MONITORING, ED - Abnormal; Notable for the following components:   Glucose-Capillary 445 (*)    All other components within normal limits  I-STAT VENOUS BLOOD GAS, ED - Abnormal; Notable for the following components:   pH, Ven 7.503 (*)    pCO2, Ven 30.0 (*)    pO2, Ven 28.0 (*)    Sodium 134 (*)    All other components within normal limits  CBC  CBG MONITORING, ED    EKG EKG Interpretation  Date/Time:  Thursday November 12 2020 18:07:18 EST Ventricular Rate:  84 PR Interval:    QRS Duration: 118 QT Interval:  386 QTC Calculation: 457 R Axis:   -42 Text Interpretation: Sinus rhythm Incomplete left bundle branch block Probable left ventricular hypertrophy Anterior Q waves, possibly due to LVH Confirmed by Madalyn Rob [phone]) on 11/12/2020 6:36:10 PM   Radiology No results found.  Procedures Procedures  Medications  Ordered in ED Medications  lactated ringers bolus 1,000 mL (0 mLs Intravenous Stopped 11/12/20 2126)  lactated ringers bolus 1,000 mL (1,000 mLs Intravenous Bolus 11/12/20 2135)    ED Course  I have reviewed the triage vital signs and the nursing notes.  Pertinent labs & imaging results that were available during my care of the patient were reviewed by me and considered in my medical decision making (see chart for details).    MDM Rules/Calculators/A&P                          Patient is a 49yoF with history and physical as described above who presents to the ED for paranoid delusions. VS reassuring and HDS. Patient resting comfortably, calm, cooperative, and does not appear to be responding to internal stimuli. Does not appear to be intoxication or acutely psychotic. No SI, HI, or AVH. Diagnostic workup demonstrated hyperglycemia without signs of DKA or HHS. UA not concerning for infection. ECG reassuring. Suspect hyperglycemia likely secondary to medication non-adherence. Presentation exhbiting paranoid delusions regarding her healthcare providers and treatment of her underlying medical conditions. Patient medically cleared for TTS for further psychiatric evaluation.  At time of sign out, awaiting TTS evaluation and recommendations. Dispo per TTS. Patient otherwise remained HDS with no acute events in ED. Spouse reliable caregiver and feels safe returning her home with him if she is discharged.  Transfer of care at 2315 to oncoming treatment team. Please refer to their note for further MDM. Patient is stable condition at time of transfer.  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

## 2020-11-12 NOTE — ED Triage Notes (Signed)
Patient BIB [facility] for evaluation of generalized weakness, decreased mobility, and "red sores" on legs that started several weeks ago. History of diabetes, does not take prescribed medication, CBG 469 with EMS. Patient alert, oriented, and in no apparent distress at this time.

## 2020-11-13 ENCOUNTER — Other Ambulatory Visit: Payer: Self-pay

## 2020-11-13 LAB — LIPID PANEL
Cholesterol: 197 mg/dL (ref 0–200)
HDL: 37 mg/dL — ABNORMAL LOW (ref >40)
LDL Cholesterol: 122 mg/dL — ABNORMAL HIGH (ref 0–99)
Total CHOL/HDL Ratio: 5.3 RATIO
Triglycerides: 190 mg/dL — ABNORMAL HIGH (ref <150)
VLDL: 38 mg/dL (ref 0–40)

## 2020-11-13 LAB — CBG MONITORING, ED
Glucose-Capillary: 331 mg/dL — ABNORMAL HIGH (ref 70–99)
Glucose-Capillary: 349 mg/dL — ABNORMAL HIGH (ref 70–99)
Glucose-Capillary: 323 mg/dL — ABNORMAL HIGH (ref 70–99)
Glucose-Capillary: 327 mg/dL — ABNORMAL HIGH (ref 70–99)

## 2020-11-13 LAB — BASIC METABOLIC PANEL
Anion gap: 12 (ref 5–15)
BUN: 19 mg/dL (ref 8–23)
CO2: 20 mmol/L — ABNORMAL LOW (ref 22–32)
Calcium: 8.9 mg/dL (ref 8.9–10.3)
Chloride: 99 mmol/L (ref 98–111)
Creatinine, Ser: 1.23 mg/dL — ABNORMAL HIGH (ref 0.44–1.00)
GFR, Estimated: 45 mL/min — ABNORMAL LOW (ref >60)
Glucose, Bld: 356 mg/dL — ABNORMAL HIGH (ref 70–99)
Potassium: 5.6 mmol/L — ABNORMAL HIGH (ref 3.5–5.1)
Sodium: 131 mmol/L — ABNORMAL LOW (ref 135–145)

## 2020-11-13 LAB — HEMOGLOBIN A1C
Hgb A1c MFr Bld: 14.5 % — ABNORMAL HIGH (ref 4.8–5.6)
Mean Plasma Glucose: 369.45 mg/dL

## 2020-11-13 MED ORDER — PSYLLIUM 95 % PO PACK
1.0000 | PACK | Freq: Every day | ORAL | Status: DC
  Administered 2020-11-13 – 2020-11-18 (×5): 1 via ORAL
  Filled 2020-11-13 (×6): qty 1

## 2020-11-13 MED ORDER — GLIMEPIRIDE 4 MG PO TABS
8.0000 mg | ORAL_TABLET | Freq: Once | ORAL | Status: AC
  Administered 2020-11-13: 8 mg via ORAL
  Filled 2020-11-13: qty 2

## 2020-11-13 MED ORDER — POLYETHYLENE GLYCOL 3350 17 G PO PACK: 17.0000 g | PACK | Freq: Every day | ORAL | Status: DC | PRN

## 2020-11-13 MED ORDER — ASPIRIN 325 MG PO TABS
325.0000 mg | ORAL_TABLET | Freq: Every day | ORAL | Status: DC
  Administered 2020-11-13 – 2020-11-15 (×3): 325 mg via ORAL
  Filled 2020-11-13 (×3): qty 1

## 2020-11-13 MED ORDER — METFORMIN HCL 500 MG PO TABS
500.0000 mg | ORAL_TABLET | Freq: Two times a day (BID) | ORAL | Status: DC
  Administered 2020-11-13 – 2020-11-14 (×3): 500 mg via ORAL
  Filled 2020-11-13 (×3): qty 1

## 2020-11-13 NOTE — ED Notes (Signed)
Dinner Tray Ordered @ 1711. 

## 2020-11-13 NOTE — ED Notes (Signed)
Patient pressed callbell crying that she was wet, patient tearful stating she "doesn't want to go through this again. I don't want to have to talk to the people again. I told them I needed a priest and they just left me here." Patient provided reassurance. Patient's brief changed and purewick replaced. Chaplain called per patient request.
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
## 2020-11-13 NOTE — BH Assessment (Signed)
Clinician informed RN Donnamarie Poag of patient being recommended for inpatient gero psych placement.  CSW to assist with placement.
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
## 2020-11-13 NOTE — ED Provider Notes (Signed)
Patient is hyperglycemic. Has SSI ordered but refused insulin because she told the nurse she's allergic. Hard to tell if this is true, I cannot find documentation in the chart that she has ever had insulin before. Husband is not able to be reached to verify.  I had given her Amaryl, which was on her medication list this morning.  Discussed with pharmacy who recommends at this point try Metformin 500 mg twice daily starting tonight given her age.  Continue to try and get in touch with husband about insulin and possible allergy as this would be best.   Sherwood Gambler, MD 11/13/20 1357

## 2020-11-13 NOTE — Progress Notes (Signed)
Pt meets inpatient criteria per Lindon Romp, FNP. Referral information has been sent to the following hospitals for review:  [facility]  [facility]       Disposition will continue to follow.   Audree Camel, MSW, LCSW, [place] Clinical Social Worker II Disposition CSW [phone]

## 2020-11-13 NOTE — Discharge Planning (Signed)
RNCM reached out to PCP office to inquire about insulin allergy.  PCP office does not have record of insulin allergy.  Information relayed to [facility]

## 2020-11-13 NOTE — Progress Notes (Signed)
Chaplain visited with patient at the request of nurse. Patient is confused and very stressed that her family is in some kind of cult and she wanted Chaplain to pray for protection for them from evil. Patient was very emotional. Wanted to make sure Chaplain believed in God. Chaplain prayed with patient and sat with her for a while. She seemed calmer and more relaxed.     11/13/20 0630  Clinical Encounter Type  Visited With Patient  Visit Type Initial  Referral From Nurse  Consult/Referral To Chaplain  Spiritual Encounters  Spiritual Needs Prayer;Emotional  Stress Factors  Patient Stress Factors Family relationships

## 2020-11-13 NOTE — ED Provider Notes (Signed)
[facility] recommended inpatient geripsych treatment.  Will need to await placement.   Fatima Blank, MD 11/13/20 [phone]

## 2020-11-14 ENCOUNTER — Emergency Department (HOSPITAL_COMMUNITY): Payer: Medicare Other

## 2020-11-14 ENCOUNTER — Encounter (HOSPITAL_COMMUNITY): Payer: Self-pay | Admitting: Internal Medicine

## 2020-11-14 ENCOUNTER — Inpatient Hospital Stay (HOSPITAL_COMMUNITY): Payer: Medicare Other

## 2020-11-14 DIAGNOSIS — Z20822 Contact with and (suspected) exposure to covid-19: Secondary | ICD-10-CM | POA: Diagnosis present

## 2020-11-14 DIAGNOSIS — D320 Benign neoplasm of cerebral meninges: Secondary | ICD-10-CM | POA: Diagnosis present

## 2020-11-14 DIAGNOSIS — E782 Mixed hyperlipidemia: Secondary | ICD-10-CM

## 2020-11-14 DIAGNOSIS — I472 Ventricular tachycardia: Secondary | ICD-10-CM

## 2020-11-14 DIAGNOSIS — E872 Acidosis, unspecified: Secondary | ICD-10-CM

## 2020-11-14 DIAGNOSIS — R000 Tachycardia, unspecified: Secondary | ICD-10-CM

## 2020-11-14 DIAGNOSIS — E1122 Type 2 diabetes mellitus with diabetic chronic kidney disease: Secondary | ICD-10-CM | POA: Diagnosis present

## 2020-11-14 DIAGNOSIS — E1165 Type 2 diabetes mellitus with hyperglycemia: Secondary | ICD-10-CM

## 2020-11-14 DIAGNOSIS — N390 Urinary tract infection, site not specified: Secondary | ICD-10-CM | POA: Diagnosis not present

## 2020-11-14 DIAGNOSIS — N1831 Chronic kidney disease, stage 3a: Secondary | ICD-10-CM

## 2020-11-14 DIAGNOSIS — I21A1 Myocardial infarction type 2: Secondary | ICD-10-CM | POA: Diagnosis present

## 2020-11-14 DIAGNOSIS — R6510 Systemic inflammatory response syndrome (SIRS) of non-infectious origin without acute organ dysfunction: Secondary | ICD-10-CM | POA: Diagnosis present

## 2020-11-14 DIAGNOSIS — E871 Hypo-osmolality and hyponatremia: Secondary | ICD-10-CM | POA: Diagnosis not present

## 2020-11-14 DIAGNOSIS — I447 Left bundle-branch block, unspecified: Secondary | ICD-10-CM | POA: Diagnosis present

## 2020-11-14 DIAGNOSIS — G928 Other toxic encephalopathy: Secondary | ICD-10-CM | POA: Diagnosis present

## 2020-11-14 DIAGNOSIS — R079 Chest pain, unspecified: Secondary | ICD-10-CM

## 2020-11-14 DIAGNOSIS — I25119 Atherosclerotic heart disease of native coronary artery with unspecified angina pectoris: Secondary | ICD-10-CM | POA: Diagnosis not present

## 2020-11-14 DIAGNOSIS — L89316 Pressure-induced deep tissue damage of right buttock: Secondary | ICD-10-CM | POA: Diagnosis present

## 2020-11-14 DIAGNOSIS — N184 Chronic kidney disease, stage 4 (severe): Secondary | ICD-10-CM | POA: Diagnosis present

## 2020-11-14 DIAGNOSIS — E860 Dehydration: Secondary | ICD-10-CM

## 2020-11-14 DIAGNOSIS — N179 Acute kidney failure, unspecified: Secondary | ICD-10-CM | POA: Insufficient documentation

## 2020-11-14 DIAGNOSIS — E1169 Type 2 diabetes mellitus with other specified complication: Secondary | ICD-10-CM | POA: Diagnosis not present

## 2020-11-14 DIAGNOSIS — I340 Nonrheumatic mitral (valve) insufficiency: Secondary | ICD-10-CM | POA: Diagnosis not present

## 2020-11-14 DIAGNOSIS — A4150 Gram-negative sepsis, unspecified: Secondary | ICD-10-CM | POA: Diagnosis not present

## 2020-11-14 DIAGNOSIS — I10 Essential (primary) hypertension: Secondary | ICD-10-CM

## 2020-11-14 DIAGNOSIS — F22 Delusional disorders: Principal | ICD-10-CM

## 2020-11-14 DIAGNOSIS — Z6841 Body Mass Index (BMI) 40.0 and over, adult: Secondary | ICD-10-CM | POA: Diagnosis not present

## 2020-11-14 DIAGNOSIS — K219 Gastro-esophageal reflux disease without esophagitis: Secondary | ICD-10-CM

## 2020-11-14 DIAGNOSIS — K8020 Calculus of gallbladder without cholecystitis without obstruction: Secondary | ICD-10-CM | POA: Diagnosis present

## 2020-11-14 DIAGNOSIS — L89326 Pressure-induced deep tissue damage of left buttock: Secondary | ICD-10-CM | POA: Diagnosis present

## 2020-11-14 DIAGNOSIS — I129 Hypertensive chronic kidney disease with stage 1 through stage 4 chronic kidney disease, or unspecified chronic kidney disease: Secondary | ICD-10-CM | POA: Diagnosis present

## 2020-11-14 DIAGNOSIS — I361 Nonrheumatic tricuspid (valve) insufficiency: Secondary | ICD-10-CM | POA: Diagnosis not present

## 2020-11-14 DIAGNOSIS — I214 Non-ST elevation (NSTEMI) myocardial infarction: Secondary | ICD-10-CM | POA: Diagnosis not present

## 2020-11-14 DIAGNOSIS — I471 Supraventricular tachycardia: Secondary | ICD-10-CM | POA: Diagnosis present

## 2020-11-14 DIAGNOSIS — F0390 Unspecified dementia without behavioral disturbance: Secondary | ICD-10-CM | POA: Diagnosis present

## 2020-11-14 DIAGNOSIS — E876 Hypokalemia: Secondary | ICD-10-CM | POA: Diagnosis present

## 2020-11-14 DIAGNOSIS — R6520 Severe sepsis without septic shock: Secondary | ICD-10-CM | POA: Diagnosis present

## 2020-11-14 HISTORY — DX: Type 2 diabetes mellitus with other specified complication: E11.69

## 2020-11-14 HISTORY — DX: Acute kidney failure, unspecified: N17.9

## 2020-11-14 HISTORY — DX: Chest pain, unspecified: R07.9

## 2020-11-14 HISTORY — DX: Delusional disorders: F22

## 2020-11-14 HISTORY — DX: Hypo-osmolality and hyponatremia: E87.1

## 2020-11-14 HISTORY — DX: Gram-negative sepsis, unspecified: N39.0

## 2020-11-14 HISTORY — DX: Acidosis, unspecified: E87.20

## 2020-11-14 HISTORY — DX: Hypomagnesemia: E83.42

## 2020-11-14 HISTORY — DX: Acidosis: E87.2

## 2020-11-14 HISTORY — DX: Ventricular tachycardia: I47.2

## 2020-11-14 HISTORY — DX: Gram-negative sepsis, unspecified: A41.50

## 2020-11-14 HISTORY — DX: Essential (primary) hypertension: I10

## 2020-11-14 HISTORY — DX: Dehydration: E86.0

## 2020-11-14 HISTORY — DX: Chronic kidney disease, stage 3a: N18.31

## 2020-11-14 HISTORY — DX: Type 2 diabetes mellitus with hyperglycemia: E11.65

## 2020-11-14 HISTORY — DX: Tachycardia, unspecified: R00.0

## 2020-11-14 LAB — BASIC METABOLIC PANEL
Anion gap: 13 (ref 5–15)
Anion gap: 13 (ref 5–15)
BUN: 23 mg/dL (ref 8–23)
BUN: 30 mg/dL — ABNORMAL HIGH (ref 8–23)
CO2: 20 mmol/L — ABNORMAL LOW (ref 22–32)
CO2: 22 mmol/L (ref 22–32)
Calcium: 8.6 mg/dL — ABNORMAL LOW (ref 8.9–10.3)
Calcium: 8.8 mg/dL — ABNORMAL LOW (ref 8.9–10.3)
Chloride: 100 mmol/L (ref 98–111)
Chloride: 98 mmol/L (ref 98–111)
Creatinine, Ser: 1.57 mg/dL — ABNORMAL HIGH (ref 0.44–1.00)
Creatinine, Ser: 2.45 mg/dL — ABNORMAL HIGH (ref 0.44–1.00)
GFR, Estimated: 34 mL/min — ABNORMAL LOW (ref >60)
GFR, Estimated: 20 mL/min — ABNORMAL LOW (ref >60)
Glucose, Bld: 365 mg/dL — ABNORMAL HIGH (ref 70–99)
Glucose, Bld: 254 mg/dL — ABNORMAL HIGH (ref 70–99)
Potassium: 3.6 mmol/L (ref 3.5–5.1)
Potassium: 3.4 mmol/L — ABNORMAL LOW (ref 3.5–5.1)
Sodium: 133 mmol/L — ABNORMAL LOW (ref 135–145)
Sodium: 133 mmol/L — ABNORMAL LOW (ref 135–145)

## 2020-11-14 LAB — URINALYSIS, ROUTINE W REFLEX MICROSCOPIC
Bilirubin Urine: NEGATIVE
Glucose, UA: >=500 mg/dL — AB
Ketones, ur: NEGATIVE mg/dL
Nitrite: NEGATIVE
Protein, ur: 100 mg/dL — AB
Specific Gravity, Urine: 1.037 — ABNORMAL HIGH (ref 1.005–1.030)
WBC, UA: >50 WBC/hpf — ABNORMAL HIGH (ref 0–5)
pH: 5 (ref 5.0–8.0)

## 2020-11-14 LAB — CBC
HCT: 43.1 % (ref 36.0–46.0)
Hemoglobin: 14 g/dL (ref 12.0–15.0)
MCH: 30.2 pg (ref 26.0–34.0)
MCHC: 32.5 g/dL (ref 30.0–36.0)
MCV: 92.9 fL (ref 80.0–100.0)
Platelets: 221 10*3/uL (ref 150–400)
RBC: 4.64 MIL/uL (ref 3.87–5.11)
RDW: 12.7 % (ref 11.5–15.5)
WBC: 14.5 10*3/uL — ABNORMAL HIGH (ref 4.0–10.5)
nRBC: 0 % (ref 0.0–0.2)

## 2020-11-14 LAB — LIPID PANEL
Cholesterol: 184 mg/dL (ref 0–200)
HDL: 33 mg/dL — ABNORMAL LOW (ref >40)
LDL Cholesterol: 115 mg/dL — ABNORMAL HIGH (ref 0–99)
Total CHOL/HDL Ratio: 5.6 RATIO
Triglycerides: 181 mg/dL — ABNORMAL HIGH (ref <150)
VLDL: 36 mg/dL (ref 0–40)

## 2020-11-14 LAB — RESP PANEL BY RT-PCR (FLU A&B, COVID) ARPGX2
Influenza A by PCR: NEGATIVE
Influenza B by PCR: NEGATIVE
SARS Coronavirus 2 by RT PCR: NEGATIVE

## 2020-11-14 LAB — CBG MONITORING, ED
Glucose-Capillary: 301 mg/dL — ABNORMAL HIGH (ref 70–99)
Glucose-Capillary: 331 mg/dL — ABNORMAL HIGH (ref 70–99)
Glucose-Capillary: 333 mg/dL — ABNORMAL HIGH (ref 70–99)
Glucose-Capillary: 234 mg/dL — ABNORMAL HIGH (ref 70–99)

## 2020-11-14 LAB — MAGNESIUM: Magnesium: 1.4 mg/dL — ABNORMAL LOW (ref 1.7–2.4)

## 2020-11-14 LAB — LACTIC ACID, PLASMA: Lactic Acid, Venous: 2.8 mmol/L (ref 0.5–1.9)

## 2020-11-14 MED ORDER — ASPIRIN 81 MG PO CHEW
324.0000 mg | CHEWABLE_TABLET | Freq: Once | ORAL | Status: AC
  Administered 2020-11-14: 324 mg via ORAL

## 2020-11-14 MED ORDER — ACETAMINOPHEN 325 MG PO TABS: 650.0000 mg | ORAL_TABLET | Freq: Four times a day (QID) | ORAL | Status: DC | PRN

## 2020-11-14 MED ORDER — INSULIN GLARGINE 100 UNIT/ML ~~LOC~~ SOLN
15.0000 [IU] | Freq: Every day | SUBCUTANEOUS | Status: DC
  Administered 2020-11-15 (×2): 15 [IU] via SUBCUTANEOUS
  Filled 2020-11-14 (×3): qty 0.15

## 2020-11-14 MED ORDER — ENOXAPARIN SODIUM 30 MG/0.3ML ~~LOC~~ SOLN
30.0000 mg | Freq: Every day | SUBCUTANEOUS | Status: DC
  Administered 2020-11-15 – 2020-11-17 (×3): 30 mg via SUBCUTANEOUS
  Filled 2020-11-14 (×3): qty 0.3

## 2020-11-14 MED ORDER — INSULIN ASPART 100 UNIT/ML ~~LOC~~ SOLN: 0.0000 [IU] | Freq: Three times a day (TID) | SUBCUTANEOUS | Status: DC

## 2020-11-14 MED ORDER — SODIUM CHLORIDE 0.9 % IV SOLN
1.0000 g | INTRAVENOUS | Status: DC
  Administered 2020-11-15 – 2020-11-17 (×4): 1 g via INTRAVENOUS
  Filled 2020-11-14: qty 1
  Filled 2020-11-14: qty 10
  Filled 2020-11-14 (×3): qty 1

## 2020-11-14 MED ORDER — PANTOPRAZOLE SODIUM 40 MG PO TBEC
40.0000 mg | DELAYED_RELEASE_TABLET | Freq: Every day | ORAL | Status: DC
  Administered 2020-11-15 – 2020-11-18 (×4): 40 mg via ORAL
  Filled 2020-11-14 (×4): qty 1

## 2020-11-14 MED ORDER — POTASSIUM CHLORIDE 2 MEQ/ML IV SOLN
INTRAVENOUS | Status: AC
  Filled 2020-11-14 (×2): qty 1000

## 2020-11-14 MED ORDER — INSULIN ASPART 100 UNIT/ML ~~LOC~~ SOLN
4.0000 [IU] | Freq: Three times a day (TID) | SUBCUTANEOUS | Status: DC
  Administered 2020-11-16 – 2020-11-18 (×7): 4 [IU] via SUBCUTANEOUS

## 2020-11-14 MED ORDER — INSULIN ASPART 100 UNIT/ML ~~LOC~~ SOLN
0.0000 [IU] | Freq: Three times a day (TID) | SUBCUTANEOUS | Status: DC
  Administered 2020-11-15: 11 [IU] via SUBCUTANEOUS
  Administered 2020-11-15: 15 [IU] via SUBCUTANEOUS
  Administered 2020-11-15: 8 [IU] via SUBCUTANEOUS
  Administered 2020-11-16 (×3): 3 [IU] via SUBCUTANEOUS
  Administered 2020-11-17 (×2): 2 [IU] via SUBCUTANEOUS
  Administered 2020-11-17 – 2020-11-18 (×2): 3 [IU] via SUBCUTANEOUS
  Administered 2020-11-18: 8 [IU] via SUBCUTANEOUS

## 2020-11-14 MED ORDER — POTASSIUM CHLORIDE CRYS ER 20 MEQ PO TBCR: 40.0000 meq | EXTENDED_RELEASE_TABLET | Freq: Once | ORAL | Status: DC

## 2020-11-14 MED ORDER — ADENOSINE 6 MG/2ML IV SOLN
6.0000 mg | Freq: Once | INTRAVENOUS | Status: DC
  Filled 2020-11-14: qty 2

## 2020-11-14 MED ORDER — MAGNESIUM SULFATE 50 % IJ SOLN
3.0000 g | Freq: Once | INTRAVENOUS | Status: AC
  Administered 2020-11-15: 3 g via INTRAVENOUS
  Filled 2020-11-14: qty 6

## 2020-11-14 MED ORDER — SODIUM CHLORIDE 0.9 % IV SOLN
1000.0000 mL | INTRAVENOUS | Status: DC
  Administered 2020-11-14: 1000 mL via INTRAVENOUS

## 2020-11-14 MED ORDER — SODIUM CHLORIDE 0.9 % IV BOLUS (SEPSIS)
1000.0000 mL | Freq: Once | INTRAVENOUS | Status: AC
  Administered 2020-11-14: 1000 mL via INTRAVENOUS

## 2020-11-14 MED ORDER — POTASSIUM CHLORIDE 2 MEQ/ML IV SOLN: INTRAVENOUS | Status: DC

## 2020-11-14 MED ORDER — LACTATED RINGERS IV SOLN: INTRAVENOUS | Status: DC

## 2020-11-14 MED ORDER — DILTIAZEM HCL 25 MG/5ML IV SOLN
10.0000 mg | Freq: Once | INTRAVENOUS | Status: AC
  Administered 2020-11-14: 10 mg via INTRAVENOUS
  Filled 2020-11-14: qty 5

## 2020-11-14 MED ORDER — ACETAMINOPHEN 650 MG RE SUPP: 650.0000 mg | Freq: Four times a day (QID) | RECTAL | Status: DC | PRN

## 2020-11-14 MED ORDER — ONDANSETRON HCL 4 MG/2ML IJ SOLN
4.0000 mg | Freq: Once | INTRAMUSCULAR | Status: AC
  Administered 2020-11-15: 4 mg via INTRAVENOUS
  Filled 2020-11-14: qty 2

## 2020-11-14 MED ORDER — AMIODARONE IV BOLUS ONLY 150 MG/100ML
150.0000 mg | Freq: Once | INTRAVENOUS | Status: AC
  Administered 2020-11-14: 150 mg via INTRAVENOUS
  Filled 2020-11-14: qty 100

## 2020-11-14 MED ORDER — ONDANSETRON HCL 4 MG PO TABS
4.0000 mg | ORAL_TABLET | Freq: Four times a day (QID) | ORAL | Status: DC | PRN
  Administered 2020-11-15: 4 mg via ORAL
  Filled 2020-11-14: qty 1

## 2020-11-14 MED ORDER — ONDANSETRON HCL 4 MG/2ML IJ SOLN
4.0000 mg | Freq: Four times a day (QID) | INTRAMUSCULAR | Status: DC | PRN
  Administered 2020-11-16: 4 mg via INTRAVENOUS
  Filled 2020-11-14 (×2): qty 2

## 2020-11-14 NOTE — Consult Note (Signed)
CONSULTATION NOTE   Patient Name: Stefanie Houston Date of Encounter: 11/14/2020 Cardiologist: No primary care provider on file.  Chief Complaint   Wide complex rhythm Chest pain  Impression   Wide complex rhythm- likely rate related bundle (undelrying IVCD) Differentials include atrial tachycardia vs flutter initially- and then rhythm becomes irregular (?MAT vs Afib).  Chest pain  Delusion (r/o metabolic encephalopathy) HTN, HLD ?CHF DM-2, GERD Obesity Hyponatremia, hypokalemia AKI- unclear  Recommendation   --> s/p diltiazem 10mg  in the ER- rate slowed down to 100s (NSR with IVCD). EKG - rate related bundle. Can clearly see p waves in the qrs complex and the PR interval is wearing with short and long- could be underlying atrial tachycardia. S/p IVF her BP improved -> continue to trend trops/ekg, keep continuous telemetry -> TSH pending -> obtain urine studies given AKI, BNP, r/o infection -ECHO in am -Continue  Aspirin 81mg , statin, put her on metoprolol tart 25mg  BID' -continue home meds In regards to chest pain- I'm unclear in this lady with some confusion and not able to provide clear history- initially there was concern for STEMI given wide complex rhythm and peaked T waves but this is bundle related and not ST elevation.  - continue medical and psychiatric care for ?delusion, psychiatric illness and home medications. -Keep K>4, Mg>2.0  -if the patient goes into irregularly irregular rhythm (aka afib) then plz notify us   Patient Profile   Stefanie Houston is a 80 y.o.femalew/ h/o CHF, T2DM, GERD, HLD, and HTN who presents to the ED via EMS form home with husband for hyperglycemia and psychiatric evaluation  HPI   Stefanie Houston is a 80 y.o. female who is being seen today for the evaluation of wide complex rhythm, chest pain at the request of Dr Tomi Bamberger (ER)  Stefanie Houston is a 80 y.o.femalew/ h/o CHF, T2DM, GERD, HLD, and HTN who presents to the ED via EMS form home  with husband for hyperglycemia and psychiatric evaluation.  I cannot get accurate history from the patient as she reports that she is here for blood sugar problem and everything went down since then. According to the chart review- she presented on 2/24- for hyperglycemia and psychiatric problem. She has been refusing to take insulin for her DM-2.  There is also Delusion- going on with her which is interfering her medical illness.  She is boarded in the ER for Geri psych eval  Today, the ER Rn noticed patient was tachycardic- wide complex rhtyhm, HR 150s so went to check the patient and she was anxious and c/o chest pressure thus calling the ER MD to eval. EKG done shows- wide complex- rate related bundle (varying QRS length) and also P waves are seen (so not VT), axis looks indeterminate. ER physician called Dr Gwenlyn Found (interventional) with concerns of wide complex rhythm and r/o STEMI. I evaluated the patient in the ER- her BP was soft but had already received IV diltiazem 10mg  x1 for this tachycardia and her HR had slowed down to 100s.  She reports she had chest pressure sensation but went away on its own. She also reports SOB, leg swelling but unable to provide much history.  She denies any syncope, recent URTI, no fevers, chills, no dysuria or abdominal pain. No prior h/o CVA or MI. Does not tell me her meds or her medical conditions.  ER- her abg is consistent with respiratory alkalosis from tachypnea, K 3.4, Na is low 133, Cr elevated 2.45, LDL 115  ECG   Wide complex rhythm- SVT with abberancy (underlying probably atrial tachycardia vs aflutter)  Telemetry   Initially was NSR with IVCD and then wide complex rhythm. After IV diltiazem slowed down - Personally Reviewed  Radiology   No results found.  Cardiac Studies   ECHO pending  CXR pending  Repeat labs pending  PMHx   Past Medical History:  Diagnosis Date  . Anemia   . CHF (congestive heart failure) ([place])   .  Diabetes mellitus without complication ([place])   . GERD (gastroesophageal reflux disease)   . Hyperlipidemia   . Hypertension     History reviewed. No pertinent surgical history.  FAMHx   No family history on file.  SOCHx    reports that she has never smoked. She has never used smokeless tobacco. No history on file for alcohol use and drug use.  Outpatient Medications   No current facility-administered medications on file prior to encounter.   Current Outpatient Medications on File Prior to Encounter  Medication Sig Dispense Refill  . acetaminophen (TYLENOL) 325 MG tablet Take 650 mg by mouth every 6 (six) hours as needed for mild pain or headache.    Marland Kitchen aspirin 325 MG tablet Take 325 mg by mouth daily as needed for mild pain or headache.    . esomeprazole (NEXIUM) 40 MG capsule Take 40 mg by mouth every morning.    Marland Kitchen guaiFENesin (MUCINEX) 600 MG 12 hr tablet Take 600 mg by mouth 2 (two) times daily.    . polyethylene glycol (MIRALAX / GLYCOLAX) 17 g packet Take 17 g by mouth daily as needed for moderate constipation.    Marland Kitchen atorvastatin (LIPITOR) 20 MG tablet Take 20 mg by mouth daily.  (Patient not taking: Reported on 11/13/2020)    . DETROL LA 4 MG 24 hr capsule Take 4 mg by mouth daily. (Patient not taking: Reported on 11/13/2020)    . glimepiride (AMARYL) 4 MG tablet Take 8 mg by mouth daily with breakfast.  (Patient not taking: Reported on 11/13/2020)    . lisinopril (ZESTRIL) 10 MG tablet Take 10 mg by mouth daily. (Patient not taking: Reported on 11/13/2020)      Inpatient Medications    Scheduled Meds: . aspirin  325 mg Oral Daily  . atorvastatin  20 mg Oral Daily  . insulin aspart  0-15 Units Subcutaneous TID WC  . lisinopril  10 mg Oral Daily  . metFORMIN  500 mg Oral BID WC  . psyllium  1 packet Oral Daily    Continuous Infusions: . sodium chloride 1,000 mL (11/14/20 2009)  . amiodarone      PRN Meds: polyethylene glycol   ALLERGIES   Allergies  Allergen  Reactions  . Insulins Other (See Comments)    Per patient: "made me feel crazy, I don't like that feeling anymore"  . Penicillins Other (See Comments)    Per patient, childhood reaction     ROS   Pertinent items are noted in HPI.  Vitals   Vitals:   11/14/20 1745 11/14/20 1800 11/14/20 1945 11/14/20 2000  BP:   92/63 (!) 88/59  Pulse: 99 92 (!) 136 (!) 136  Resp:   19 (!) 22  Temp:      TempSrc:      SpO2: 99% 98%  94%  Weight:      Height:        Intake/Output Summary (Last 24 hours) at 11/14/2020 2122 Last data filed at 11/14/2020 2114 Gross per  24 hour  Intake 2000 ml  Output --  Net 2000 ml   Filed Weights   11/12/20 1929  Weight: 99.8 kg    Physical Exam   HEENT: Normocephalic and atraumatic, obese lady on oxygen Neck: Supple. No carotid bruits.  JVD.- difficult to appreciate Heart: tachycardic rhythm,  normal S1 and S2.no murmurs No gallops or rubs. Radial and distal pedal pulses 2+ and equal bilaterally. Lungs: mostly clear, no crackles, wheeze Abdomen: Soft, non-distended, and non-tender to palpation. Bowel sounds present. Extremities: 2+ lower extremity edema.    Skin: Warm and dry. Neuro: Alert,oriented x3, No focal deficits. Psych: Normal affect. Responds appropriately.  Labs   Results for orders placed or performed during the hospital encounter of 11/12/20 (from the past 48 hour(s))  CBG monitoring, ED     Status: Abnormal   Collection Time: 11/12/20 11:34 PM  Result Value Ref Range   Glucose-Capillary 305 (H) 70 - 99 mg/dL    Comment: Glucose reference range applies only to samples taken after fasting for at least 8 hours.  CBG monitoring, ED     Status: Abnormal   Collection Time: 11/13/20  8:23 AM  Result Value Ref Range   Glucose-Capillary 331 (H) 70 - 99 mg/dL    Comment: Glucose reference range applies only to samples taken after fasting for at least 8 hours.  CBG monitoring, ED     Status: Abnormal   Collection Time: 11/13/20 12:28 PM   Result Value Ref Range   Glucose-Capillary 349 (H) 70 - 99 mg/dL    Comment: Glucose reference range applies only to samples taken after fasting for at least 8 hours.  CBG monitoring, ED     Status: Abnormal   Collection Time: 11/13/20  5:07 PM  Result Value Ref Range   Glucose-Capillary 323 (H) 70 - 99 mg/dL    Comment: Glucose reference range applies only to samples taken after fasting for at least 8 hours.  CBG monitoring, ED     Status: Abnormal   Collection Time: 11/13/20 10:34 PM  Result Value Ref Range   Glucose-Capillary 327 (H) 70 - 99 mg/dL    Comment: Glucose reference range applies only to samples taken after fasting for at least 8 hours.  Lipid panel     Status: Abnormal   Collection Time: 11/13/20 10:50 PM  Result Value Ref Range   Cholesterol 197 0 - 200 mg/dL   Triglycerides 190 (H) <150 mg/dL   HDL 37 (L) >40 mg/dL   Total CHOL/HDL Ratio 5.3 RATIO   VLDL 38 0 - 40 mg/dL   LDL Cholesterol 122 (H) 0 - 99 mg/dL    Comment:        Total Cholesterol/HDL:CHD Risk Coronary Heart Disease Risk Table                     Men   Women  1/2 Average Risk   3.4   3.3  Average Risk       5.0   4.4  2 X Average Risk   9.6   7.1  3 X Average Risk  23.4   11.0        Use the calculated Patient Ratio above and the CHD Risk Table to determine the patient's CHD Risk.        ATP III CLASSIFICATION (LDL):  <100     mg/dL   Optimal  100-129  mg/dL   Near or Above  Optimal  130-159  mg/dL   Borderline  160-189  mg/dL   High  >190     mg/dL   Very High Performed at [facility] [address] [place], [place] [address]   Basic metabolic panel     Status: Abnormal   Collection Time: 11/13/20 10:50 PM  Result Value Ref Range   Sodium 131 (L) 135 - 145 mmol/L   Potassium 5.6 (H) 3.5 - 5.1 mmol/L    Comment: SPECIMEN HEMOLYZED. HEMOLYSIS MAY AFFECT INTEGRITY OF RESULTS.   Chloride 99 98 - 111 mmol/L   CO2 20 (L) 22 - 32 mmol/L   Glucose, Bld 356 (H) 70  - 99 mg/dL    Comment: Glucose reference range applies only to samples taken after fasting for at least 8 hours.   BUN 19 8 - 23 mg/dL   Creatinine, Ser 1.23 (H) 0.44 - 1.00 mg/dL   Calcium 8.9 8.9 - 10.3 mg/dL   GFR, Estimated 45 (L) >60 mL/min    Comment: (NOTE) Calculated using the CKD-EPI Creatinine Equation (2021)    Anion gap 12 5 - 15    Comment: Performed at [facility] [address] [place], [place] [address]  Lipid panel     Status: Abnormal   Collection Time: 11/14/20  3:27 AM  Result Value Ref Range   Cholesterol 184 0 - 200 mg/dL   Triglycerides 181 (H) <150 mg/dL   HDL 33 (L) >40 mg/dL   Total CHOL/HDL Ratio 5.6 RATIO   VLDL 36 0 - 40 mg/dL   LDL Cholesterol 115 (H) 0 - 99 mg/dL    Comment:        Total Cholesterol/HDL:CHD Risk Coronary Heart Disease Risk Table                     Men   Women  1/2 Average Risk   3.4   3.3  Average Risk       5.0   4.4  2 X Average Risk   9.6   7.1  3 X Average Risk  23.4   11.0        Use the calculated Patient Ratio above and the CHD Risk Table to determine the patient's CHD Risk.        ATP III CLASSIFICATION (LDL):  <100     mg/dL   Optimal  100-129  mg/dL   Near or Above                    Optimal  130-159  mg/dL   Borderline  160-189  mg/dL   High  >190     mg/dL   Very High Performed at [facility] [address], [place], [place] [address]   CBG monitoring, ED     Status: Abnormal   Collection Time: 11/14/20  8:00 AM  Result Value Ref Range   Glucose-Capillary 301 (H) 70 - 99 mg/dL    Comment: Glucose reference range applies only to samples taken after fasting for at least 8 hours.  CBG monitoring, ED     Status: Abnormal   Collection Time: 11/14/20  9:41 AM  Result Value Ref Range   Glucose-Capillary 331 (H) 70 - 99 mg/dL    Comment: Glucose reference range applies only to samples taken after fasting for at least 8 hours.  Basic metabolic panel     Status: Abnormal   Collection Time:  11/14/20  9:50  AM  Result Value Ref Range   Sodium 133 (L) 135 - 145 mmol/L   Potassium 3.6 3.5 - 5.1 mmol/L    Comment: DELTA CHECK NOTED   Chloride 100 98 - 111 mmol/L   CO2 20 (L) 22 - 32 mmol/L   Glucose, Bld 365 (H) 70 - 99 mg/dL    Comment: Glucose reference range applies only to samples taken after fasting for at least 8 hours.   BUN 23 8 - 23 mg/dL   Creatinine, Ser 1.57 (H) 0.44 - 1.00 mg/dL   Calcium 8.6 (L) 8.9 - 10.3 mg/dL   GFR, Estimated 34 (L) >60 mL/min    Comment: (NOTE) Calculated using the CKD-EPI Creatinine Equation (2021)    Anion gap 13 5 - 15    Comment: Performed at [facility] [address] [place], [place] [address]  CBG monitoring, ED     Status: Abnormal   Collection Time: 11/14/20 12:34 PM  Result Value Ref Range   Glucose-Capillary 333 (H) 70 - 99 mg/dL    Comment: Glucose reference range applies only to samples taken after fasting for at least 8 hours.  CBG monitoring, ED     Status: Abnormal   Collection Time: 11/14/20  5:22 PM  Result Value Ref Range   Glucose-Capillary 234 (H) 70 - 99 mg/dL    Comment: Glucose reference range applies only to samples taken after fasting for at least 8 hours.  Basic metabolic panel     Status: Abnormal   Collection Time: 11/14/20  8:08 PM  Result Value Ref Range   Sodium 133 (L) 135 - 145 mmol/L   Potassium 3.4 (L) 3.5 - 5.1 mmol/L   Chloride 98 98 - 111 mmol/L   CO2 22 22 - 32 mmol/L   Glucose, Bld 254 (H) 70 - 99 mg/dL    Comment: Glucose reference range applies only to samples taken after fasting for at least 8 hours.   BUN 30 (H) 8 - 23 mg/dL   Creatinine, Ser 2.45 (H) 0.44 - 1.00 mg/dL   Calcium 8.8 (L) 8.9 - 10.3 mg/dL   GFR, Estimated 20 (L) >60 mL/min    Comment: (NOTE) Calculated using the CKD-EPI Creatinine Equation (2021)    Anion gap 13 5 - 15    Comment: Performed at [facility] [address] [place], [place] [address]  CBC     Status: Abnormal   Collection Time: 11/14/20   8:08 PM  Result Value Ref Range   WBC 14.5 (H) 4.0 - 10.5 K/uL   RBC 4.64 3.87 - 5.11 MIL/uL   Hemoglobin 14.0 12.0 - 15.0 g/dL   HCT 43.1 36.0 - 46.0 %   MCV 92.9 80.0 - 100.0 fL   MCH 30.2 26.0 - 34.0 pg   MCHC 32.5 30.0 - 36.0 g/dL   RDW 12.7 11.5 - 15.5 %   Platelets 221 150 - 400 K/uL   nRBC 0.0 0.0 - 0.2 %    Comment: Performed at [facility], [place] [address] [place], [place] [address]        Length of Stay:  LOS: 0 days      Renae Fickle 11/14/2020, 9:22 PM

## 2020-11-14 NOTE — ED Notes (Signed)
MD at bedside. 

## 2020-11-14 NOTE — ED Notes (Signed)
Pt changed out into burgundy scrubs with a clean purwick in place.

## 2020-11-14 NOTE — ED Provider Notes (Signed)
Emergency Medicine Observation Re-evaluation Note  Stefanie Houston is a 80 y.o. female, seen on rounds today.  Pt initially presented to the ED for complaints of Weakness Currently, the patient is eating breakfast, no complaints.  Initially the patient would not take any insulin because she states that she was allergic to it, reached out to PCPs office to inquire about insulin allergy and they did not have any record of insulin allergy.  CBG this morning 300, patient states that she will try insulin today.  Insulin given, no immediate reactions.  Patient had just received breakfast tray, ate entire breakfast and then glucose was rechecked and it was 330.  We will continue to monitor.  Did also obtain repeat BMP since potassium was 5.7, on repeat 3.6.  Does have some creatinine increased from 1.23-1.57, will continue to monitor.  Physical Exam  BP (!) 103/55   Pulse 78   Temp 98.6 F (37 C) (Oral)   Resp 18   Ht 5\' 2"  (1.575 m)   Wt 99.8 kg   SpO2 94%   BMI 40.24 kg/m  Physical Exam General: Alert awake and eating breakfast Cardiac: Regular rate  Lungs: Nonlabored Psych: No changes  ED Course / MDM  EKG:EKG Interpretation  Date/Time:  Thursday November 12 2020 18:07:18 EST Ventricular Rate:  84 PR Interval:    QRS Duration: 118 QT Interval:  386 QTC Calculation: 457 R Axis:   -42 Text Interpretation: Sinus rhythm Incomplete left bundle branch block Probable left ventricular hypertrophy Anterior Q waves, possibly due to LVH Confirmed by Madalyn Rob [phone]) on 11/12/2020 6:36:10 PM    I have reviewed the labs performed to date as well as medications administered while in observation.  Recent changes in the last 24 hours include insulin.   Plan  Current plan is for [facility] psych placement Patient is not under full IVC at this time.   Alfredia Client, PA-C 11/14/20 1205    Truddie Hidden, MD 11/14/20 1215

## 2020-11-14 NOTE — H&P (Signed)
History and Physical    Stefanie Houston IRS:[id number] DOB: 1941/06/17 DOA: 11/12/2020  PCP: Rudene Anda, MD  Patient coming from: Home   Chief Complaint:  Chief Complaint  Patient presents with  . Weakness     HPI:    80 year old female with past history of diabetes mellitus type 2, hypertension, hyperlipidemia, gastroesophageal reflux disease, meningioma, coronary artery disease (S/P cath 2012 with stent placement), who initially presented to [facility] emergency department via EMS due to concerns for the husband for progressive paranoid delusions.  Patient is a poor historian due to lethargy, intermittent confusion and delusions of paranoia.  History is been obtained from the husband via phone conversation and discussion with the emergency department staff.  According to the husband, for approximate the past 1-1/2 years patient has not followed up with her outpatient providers for her numerous medical problems.  Additionally, patient has not taken any of her prescribed medication.  This is because over the past 1/2 years the patient has developed increasingly worsening delusions that all medical staff are involved with a type of cult that are attempting to control her.  Husband reports that in the past several months his delusions have become more and more intense.  Furthermore, in the past several weeks patient has exhibited increasing lethargy and intermittent confusion.  Husband patient not complained of any dysuria, shortness of breath, change in her chronic cough or fevers.   Due to progressively worsening symptoms EMS was contacted who promptly came to evaluate the patient and brought her to [facility] for evaluation.  Upon initial evaluation of the patient on 2/24, patient was found to be hypoglycemic and initially refused insulin due to reported insulin allergy.  Other than that, patient continued to exhibit delusions of paranoia as mentioned above.  Behavioral  health was contacted and arrangements were made for the patient to eventually dispositioned for further psychiatric care.  Patient continued to wait for psychiatric bed for approximately 48 hours unfortunately on 2/26, the patient began to exhibit progressively worsening tachycardia with intermittent complaints of chest discomfort and shortness of breath.  Heart rates continue to rise into the 130s.  Tachycardia did not seem to improve after initiation of 10 mg of IV diltiazem and 150 mg of amiodarone.  This prompted a call to cardiology and a consultation was performed by Dr. Rudi Rummage.  Repeat blood work revealed patient was developing acute kidney injury with a creatinine of 2.45.  Patient started to become increasingly hypotensive with systolic blood pressures occasionally in the 80s and blood work also revealed patient was developing a leukocytosis of 14.5 suggesting possible developing infection.  The hospitalist group was then called to assess the patient for admission the hospital.  Review of Systems:   Review of Systems  Respiratory: Positive for cough.   Gastrointestinal: Positive for diarrhea.  Neurological: Positive for weakness.  All other systems reviewed and are negative.   Past Medical History:  Diagnosis Date  . Anemia   . Diabetes mellitus without complication ([place])   . GERD (gastroesophageal reflux disease)   . Hyperlipidemia   . Hypertension     History reviewed. No pertinent surgical history.   reports that she has never smoked. She has never used smokeless tobacco. No history on file for alcohol use and drug use.  Allergies  Allergen Reactions  . Insulins Other (See Comments)    Per patient: "made me feel crazy, I don't like that feeling anymore"  UPDATE 2/26 - ER provider called  PCP and they report no known evidence of insulin allergy.  . Penicillins Other (See Comments)    Per patient, childhood reaction     Family History  Problem Relation Age of Onset   . Psychiatric Illness Neg Hx      Prior to Admission medications   Medication Sig Start Date End Date Taking? Authorizing Provider  acetaminophen (TYLENOL) 325 MG tablet Take 650 mg by mouth every 6 (six) hours as needed for mild pain or headache.   Yes [provider]  aspirin 325 MG tablet Take 325 mg by mouth daily as needed for mild pain or headache.   Yes [provider]  esomeprazole (NEXIUM) 40 MG capsule Take 40 mg by mouth every morning.   Yes [provider]  guaiFENesin (MUCINEX) 600 MG 12 hr tablet Take 600 mg by mouth 2 (two) times daily. 09/30/18  Yes [provider]  polyethylene glycol (MIRALAX / GLYCOLAX) 17 g packet Take 17 g by mouth daily as needed for moderate constipation.   Yes [provider]  atorvastatin (LIPITOR) 20 MG tablet Take 20 mg by mouth daily.  Patient not taking: Reported on 11/13/2020 05/08/17   [provider]  DETROL LA 4 MG 24 hr capsule Take 4 mg by mouth daily. Patient not taking: Reported on 11/13/2020 05/25/20   [provider]  glimepiride (AMARYL) 4 MG tablet Take 8 mg by mouth daily with breakfast.  Patient not taking: Reported on 11/13/2020 01/14/20   [provider]  lisinopril (ZESTRIL) 10 MG tablet Take 10 mg by mouth daily. Patient not taking: Reported on 11/13/2020 04/27/20   [provider]    Physical Exam: Vitals:   11/14/20 2100 11/14/20 2115 11/14/20 2130 11/14/20 2339  BP: (!) 105/59 107/79 92/60 104/70  Pulse: 63 (!) 129 (!) 129 (!) 131  Resp: (!) 24 20 (!) 22 (!) 26  Temp:    98.9 F (37.2 C)  TempSrc:    Oral  SpO2: 95% 94% 95% (!) 81%  Weight:      Height:        Constitutional: Acute alert and oriented x3, no associated distress.   Skin: no rashes, no lesions, extremely poor skin turgor noted Eyes: Pupils are equally reactive to light.  No evidence of scleral icterus or conjunctival pallor.  ENMT: Extremely dry mucous membranes noted.   Posterior pharynx clear of any exudate or lesions.   Neck: normal, supple, no masses, no thyromegaly.  No evidence of jugular venous distension.   Respiratory: Notable bibasilar rales with scattered rhonchi bilaterally.  Patient is slightly tachypneic without evidence of accessory muscle use. Cardiovascular: Tachycardic rate with regular rhythm, no murmurs / rubs / gallops. No extremity edema. 2+ pedal pulses. No carotid bruits.  Chest:   Nontender without crepitus or deformity.   Back:   Nontender without crepitus or deformity. Abdomen: Abdomen is soft and nontender.  No evidence of intra-abdominal masses.  Positive bowel sounds noted in all quadrants.   Musculoskeletal: No joint deformity upper and lower extremities. Good ROM, no contractures. Normal muscle tone.  Neurologic: CN 2-12 grossly intact. Sensation intact.  Patient moving all 4 extremities spontaneously.  Patient is following all commands.  Patient is responsive to verbal stimuli.   Psychiatric: Patient continues to state that she does not trust doctors due to concerns that have been part of a cult.  Patient currently seems to possess a normal mood with appropriate affect.  Patient currently does not seem to  possess insight as to her current situation.  Labs on Admission: I have personally reviewed following labs and imaging studies -   CBC: Recent Labs  Lab 11/12/20 1804 11/12/20 1903 11/14/20 2008  WBC 8.3  --  14.5*  HGB 13.5 13.6 14.0  HCT 41.6 40.0 43.1  MCV 91.6  --  92.9  PLT 247  --  354   Basic Metabolic Panel: Recent Labs  Lab 11/12/20 1804 11/12/20 1903 11/13/20 2250 11/14/20 0950 11/14/20 2008 11/14/20 2156  NA 133* 134* 131* 133* 133*  --   K 4.0 4.3 5.6* 3.6 3.4*  --   CL 99  --  99 100 98  --   CO2 23  --  20* 20* 22  --   GLUCOSE 400*  --  356* 365* 254*  --   BUN 16  --  19 23 30*  --   CREATININE 1.26*  --  1.23* 1.57* 2.45*  --   CALCIUM 9.5  --  8.9 8.6* 8.8*  --   MG  --   --   --   --   --   1.4*   GFR: Estimated Creatinine Clearance: 20.9 mL/min (A) (by C-G formula based on SCr of 2.45 mg/dL (H)). Liver Function Tests: No results for input(s): AST, ALT, ALKPHOS, BILITOT, PROT, ALBUMIN in the last 168 hours. No results for input(s): LIPASE, AMYLASE in the last 168 hours. No results for input(s): AMMONIA in the last 168 hours. Coagulation Profile: No results for input(s): INR, PROTIME in the last 168 hours. Cardiac Enzymes: No results for input(s): CKTOTAL, CKMB, CKMBINDEX, TROPONINI in the last 168 hours. BNP (last 3 results) No results for input(s): PROBNP in the last 8760 hours. HbA1C: Recent Labs    11/12/20 1908  HGBA1C 14.5*   CBG: Recent Labs  Lab 11/13/20 2234 11/14/20 0800 11/14/20 0941 11/14/20 1234 11/14/20 1722  GLUCAP 327* 301* 331* 333* 234*   Lipid Profile: Recent Labs    11/13/20 2250 11/14/20 0327  CHOL 197 184  HDL 37* 33*  LDLCALC 122* 115*  TRIG 190* 181*  CHOLHDL 5.3 5.6   Thyroid Function Tests: No results for input(s): TSH, T4TOTAL, FREET4, T3FREE, THYROIDAB in the last 72 hours. Anemia Panel: No results for input(s): VITAMINB12, FOLATE, FERRITIN, TIBC, IRON, RETICCTPCT in the last 72 hours. Urine analysis:    Component Value Date/Time   COLORURINE AMBER (A) 11/14/2020 2118   APPEARANCEUR CLOUDY (A) 11/14/2020 2118   LABSPEC 1.037 (H) 11/14/2020 2118   PHURINE 5.0 11/14/2020 2118   GLUCOSEU >=500 (A) 11/14/2020 2118   HGBUR SMALL (A) 11/14/2020 2118   BILIRUBINUR NEGATIVE 11/14/2020 2118   [place] 11/14/2020 2118   PROTEINUR 100 (A) 11/14/2020 2118   NITRITE NEGATIVE 11/14/2020 2118   LEUKOCYTESUR LARGE (A) 11/14/2020 2118    Radiological Exams on Admission - Personally Reviewed: DG Chest Portable 1 View  Result Date: 11/14/2020 CLINICAL DATA:  Chest pain. EXAM: PORTABLE CHEST 1 VIEW COMPARISON:  September 27, 2018. FINDINGS: Stable cardiomediastinal silhouette. No pneumothorax or pleural effusion is noted.  Both lungs are clear. The visualized skeletal structures are unremarkable. IMPRESSION: No active disease. Electronically Signed   By: Marijo Conception M.D.   On: 11/14/2020 21:55    EKG: Personally reviewed.  Rhythm is wide-complex regular tachycardia, possibly sinus tachycardia or atrial tachycardia.  With heart rate of 121 bpm..  No dynamic ST segment changes appreciated.  Assessment/Plan Principal Problem:   Sepsis due to gram-negative UTI (  [place])   Patient exhibiting multiple SIRS criteria including substantial leukocytosis, tachycardia, tachypnea in the setting of urinary tract infection with lactic acidosis and acute kidney injury concerning for development of sepsis  Patient was transiently hypotensive which seems to be improved somewhat with intravenous volume resuscitation.  Continuing to hydrate patient aggressively with intravenous isotonic fluids.  30 cc/kg boluses given followed by lactated Ringer infusion.  Blood and urine cultures obtained  Performing serial lactic acid levels to ensure downtrending and resolution  Treating with intravenous ceftriaxone  Pending CT imaging abdomen and pelvis  We will de-escalate antibiotic therapy based on culture results  Active Problems:   Wide-complex tachycardia ([place])   ER provider was initially concerned for possible supraventricular tachycardia and administered 10 mg of diltiazem followed by amiodarone.  ER provider requested cardiology consultation completed by Dr. Rudi Rummage.  Cardiology feels that patient may be experiencing episodes of atrial tachycardia.   Personally I feel the patient is likely suffering from sinus tachycardia secondary to underlying sepsis  At this point, I believe achieving rate control can best be done by hydrating patient aggressively and treating underlying sepsis with intravenous antibiotics  Additionally correcting patient's hypokalemia and hypomagnesemia   Monitoring patient on telemetry and  cycling troponins  Of note, review previous records from [place] reveals the patient has a history of left bundle branch block  Will contact cardiology Phillip Heal if there is a rhythm change    Chest pain   Questionable transient chest discomfort earlier in the evening  Patient is currently chest pain-free  Patient has known history of coronary artery disease  Monitoring patient on telemetry  Cycling cardiac enzymes    Acute renal failure superimposed on stage 3a chronic kidney disease ([place])  Patient developed acute kidney injury during her stay here in the emergency department, felt to be secondary to progressive volume depletion and development of sepsis  Hydrating patient with intravenous isotonic fluids  If renal function fails to rapidly improve will expand work-up with urine electrolytes  Obtaining CT imaging of abdomen which can additionally identified any evidence of hydronephrosis/postobstructive etiology  Strict input and output monitoring  Monitoring renal function and electrolytes with serial chemistries    Lactic acidosis   Lactic acidosis felt to be secondary to volume depletion and sepsis  Hydrating patient with intravenous isotonic fluids  Treating underlying infection with intravenous antibiotics  Performing serial lactic acid levels to ensure downtrending and resolution    Delusion ([place])   Patient was originally to be psychiatric admission due to ongoing delusions for what sounds to be approximately 1 1/2 years  We will touch base with behavioral health after medical issues have stabilized/resolved to once again pursue psychiatric disposition    Essential hypertension   Holding antihypertensives in the setting of transient hypotension this evening  Patient has historically been noncompliant with her home medications due to delusions of paranoia prior to admission    Mixed diabetic hyperlipidemia associated with type 2 diabetes  mellitus ([place])   Patient has historically been noncompliant with medications for at least the past several months due to delusions of paranoia  Will resume statin therapy    Dehydration with hyponatremia   Clinical evidence of volume depletion with concurrent hyponatremia on chemistry  Hydrating patient with intravenous isotonic fluids  Monitoring sodium levels with serial chemistries    Coronary artery disease involving native coronary artery of native heart   patient has a known history of coronary artery disease status post cardiac  catheterization and stent placement 2012    Patient did have some transient chest discomfort earlier which is now resolved  Monitoring patient on telemetry  Resuming home regimen of statin therapy  Aspirin daily  Hypomagnesemia   Replacing with intravenous magnesium sulfate  Monitoring magnesium levels with serial chemistries    Uncontrolled type 2 diabetes mellitus with hyperglycemia, without long-term current use of insulin ([place])   Patient has longstanding history of poorly controlled diabetes  As of late, patient has been refusing many of her medications due to delusions of paranoia  Additionally, patient initially reported that she was allergic to insulin however ER provider discussed this concern with the patient's PCP this morning and there has been no known documented history of insulin allergy.  Accu-Cheks before every meal and nightly with slight scale insulin  Hemoglobin A1c is 14.5% poor outpatient control  Placing patient on basal bolus insulin therapy with Lantus 15 units nightly and NovoLog 5 units before every meal    GERD without esophagitis    Resuming home regimen of daily PPI   Code Status:  Full code Family Communication: Husband has been updated on plan of care via phone conversation  Status is: Inpatient  Remains inpatient appropriate because:Ongoing diagnostic testing needed not appropriate for  outpatient work up, IV treatments appropriate due to intensity of illness or inability to take PO and Inpatient level of care appropriate due to severity of illness   Dispo: The patient is from: Home              Anticipated d/c is to: Home              Patient currently is not medically stable to d/c.   Difficult to place patient No        Vernelle Emerald MD [facility] Pager [phone]  If 7PM-7AM, please contact night-coverage [email or web address] Use universal Twisp password for that web site. If you do not have the password, please call the hospital operator.  11/15/2020, 12:01 AM

## 2020-11-14 NOTE — ED Notes (Addendum)
PT BELONGINGS PLACED IN LOCKER #1. Patients glasses at bedside.

## 2020-11-14 NOTE — ED Notes (Addendum)
RN placed pt on cardiac monitor and O2 on 3L

## 2020-11-14 NOTE — ED Notes (Signed)
Husband at  The bedside

## 2020-11-14 NOTE — ED Provider Notes (Addendum)
Notified that pt is tachcyardic.  HR in the 130s, sinus tach vs a flutter 2:1.  ECG is concerning. BP is also low.  Will give fluid bolus.  Last set up labs was 2 days ago. Will repeat electrolytes.  Check trop.  Consult with cardiology.   Dr Gwenlyn Found suggested amiodarone.  BP has improved.  Will give dose of cardizem iv.  Monitor bp and rate   Dorie Rank, MD 11/14/20 2009   HR still  120s.  BP now 110s.  Given cardizem earlier.  With borderline bp will try dose of amiodarone   Dorie Rank, MD 11/14/20 2104

## 2020-11-14 NOTE — ED Notes (Signed)
RN attempted report x1.  

## 2020-11-15 ENCOUNTER — Inpatient Hospital Stay (HOSPITAL_COMMUNITY): Payer: Medicare Other

## 2020-11-15 DIAGNOSIS — N390 Urinary tract infection, site not specified: Secondary | ICD-10-CM | POA: Diagnosis not present

## 2020-11-15 DIAGNOSIS — A4150 Gram-negative sepsis, unspecified: Secondary | ICD-10-CM | POA: Diagnosis not present

## 2020-11-15 LAB — COMPREHENSIVE METABOLIC PANEL
ALT: 29 U/L (ref 0–44)
AST: 37 U/L (ref 15–41)
Albumin: 2.8 g/dL — ABNORMAL LOW (ref 3.5–5.0)
Alkaline Phosphatase: 54 U/L (ref 38–126)
Anion gap: 14 (ref 5–15)
BUN: 29 mg/dL — ABNORMAL HIGH (ref 8–23)
CO2: 17 mmol/L — ABNORMAL LOW (ref 22–32)
Calcium: 7.7 mg/dL — ABNORMAL LOW (ref 8.9–10.3)
Chloride: 101 mmol/L (ref 98–111)
Creatinine, Ser: 2.21 mg/dL — ABNORMAL HIGH (ref 0.44–1.00)
GFR, Estimated: 22 mL/min — ABNORMAL LOW (ref >60)
Glucose, Bld: 395 mg/dL — ABNORMAL HIGH (ref 70–99)
Potassium: 3.9 mmol/L (ref 3.5–5.1)
Sodium: 132 mmol/L — ABNORMAL LOW (ref 135–145)
Total Bilirubin: 1.1 mg/dL (ref 0.3–1.2)
Total Protein: 6.7 g/dL (ref 6.5–8.1)

## 2020-11-15 LAB — CBC WITH DIFFERENTIAL/PLATELET
Abs Immature Granulocytes: 0.08 10*3/uL — ABNORMAL HIGH (ref 0.00–0.07)
Basophils Absolute: 0.1 10*3/uL (ref 0.0–0.1)
Basophils Relative: 0 %
Eosinophils Absolute: 0 10*3/uL (ref 0.0–0.5)
Eosinophils Relative: 0 %
HCT: 40.1 % (ref 36.0–46.0)
Hemoglobin: 13.2 g/dL (ref 12.0–15.0)
Immature Granulocytes: 1 %
Lymphocytes Relative: 8 %
Lymphs Abs: 1.1 10*3/uL (ref 0.7–4.0)
MCH: 30.6 pg (ref 26.0–34.0)
MCHC: 32.9 g/dL (ref 30.0–36.0)
MCV: 93 fL (ref 80.0–100.0)
Monocytes Absolute: 0.9 10*3/uL (ref 0.1–1.0)
Monocytes Relative: 6 %
Neutro Abs: 12.7 10*3/uL — ABNORMAL HIGH (ref 1.7–7.7)
Neutrophils Relative %: 85 %
Platelets: 198 10*3/uL (ref 150–400)
RBC: 4.31 MIL/uL (ref 3.87–5.11)
RDW: 12.9 % (ref 11.5–15.5)
WBC: 14.9 10*3/uL — ABNORMAL HIGH (ref 4.0–10.5)
nRBC: 0 % (ref 0.0–0.2)

## 2020-11-15 LAB — BLOOD GAS, ARTERIAL
Acid-base deficit: 8.6 mmol/L — ABNORMAL HIGH (ref 0.0–2.0)
Bicarbonate: 16 mmol/L — ABNORMAL LOW (ref 20.0–28.0)
FIO2: 36
O2 Saturation: 90 %
Patient temperature: 37.1
pCO2 arterial: 30.2 mmHg — ABNORMAL LOW (ref 32.0–48.0)
pH, Arterial: 7.343 — ABNORMAL LOW (ref 7.350–7.450)
pO2, Arterial: 64.6 mmHg — ABNORMAL LOW (ref 83.0–108.0)

## 2020-11-15 LAB — PROTIME-INR
INR: 1.1 (ref 0.8–1.2)
Prothrombin Time: 13.6 seconds (ref 11.4–15.2)

## 2020-11-15 LAB — VITAMIN B12: Vitamin B-12: 184 pg/mL (ref 180–914)

## 2020-11-15 LAB — GLUCOSE, CAPILLARY
Glucose-Capillary: 267 mg/dL — ABNORMAL HIGH (ref 70–99)
Glucose-Capillary: 294 mg/dL — ABNORMAL HIGH (ref 70–99)
Glucose-Capillary: 307 mg/dL — ABNORMAL HIGH (ref 70–99)
Glucose-Capillary: 307 mg/dL — ABNORMAL HIGH (ref 70–99)
Glucose-Capillary: 314 mg/dL — ABNORMAL HIGH (ref 70–99)
Glucose-Capillary: 431 mg/dL — ABNORMAL HIGH (ref 70–99)

## 2020-11-15 LAB — TSH: TSH: 1.138 u[IU]/mL (ref 0.350–4.500)

## 2020-11-15 LAB — PROCALCITONIN: Procalcitonin: <0.1 ng/mL

## 2020-11-15 LAB — CORTISOL-AM, BLOOD: Cortisol - AM: 38.6 ug/dL — ABNORMAL HIGH (ref 6.7–22.6)

## 2020-11-15 LAB — BRAIN NATRIURETIC PEPTIDE: B Natriuretic Peptide: 73.3 pg/mL (ref 0.0–100.0)

## 2020-11-15 LAB — LACTIC ACID, PLASMA: Lactic Acid, Venous: 2.4 mmol/L (ref 0.5–1.9)

## 2020-11-15 LAB — TROPONIN I (HIGH SENSITIVITY)
Troponin I (High Sensitivity): 301 ng/L (ref <18)
Troponin I (High Sensitivity): 415 ng/L (ref <18)
Troponin I (High Sensitivity): 361 ng/L (ref <18)

## 2020-11-15 LAB — MAGNESIUM: Magnesium: 1.7 mg/dL (ref 1.7–2.4)

## 2020-11-15 LAB — APTT: aPTT: 30 seconds (ref 24–36)

## 2020-11-15 LAB — FOLATE: Folate: 7.1 ng/mL (ref >5.9)

## 2020-11-15 MED ORDER — ASPIRIN EC 81 MG PO TBEC
81.0000 mg | DELAYED_RELEASE_TABLET | Freq: Every day | ORAL | Status: DC
  Administered 2020-11-15 – 2020-11-18 (×4): 81 mg via ORAL
  Filled 2020-11-15 (×4): qty 1

## 2020-11-15 MED ORDER — HYDROXYZINE HCL 25 MG PO TABS: 25.0000 mg | ORAL_TABLET | Freq: Three times a day (TID) | ORAL | Status: DC | PRN

## 2020-11-15 MED ORDER — METOPROLOL TARTRATE 25 MG PO TABS
25.0000 mg | ORAL_TABLET | Freq: Two times a day (BID) | ORAL | Status: DC
  Administered 2020-11-15: 25 mg via ORAL
  Filled 2020-11-15: qty 1

## 2020-11-15 MED ORDER — PROCHLORPERAZINE EDISYLATE 10 MG/2ML IJ SOLN
5.0000 mg | Freq: Four times a day (QID) | INTRAMUSCULAR | Status: DC | PRN
  Administered 2020-11-15: 5 mg via INTRAVENOUS
  Filled 2020-11-15 (×2): qty 1

## 2020-11-15 MED ORDER — SODIUM CHLORIDE 0.9 % IV BOLUS
500.0000 mL | Freq: Once | INTRAVENOUS | Status: AC
  Administered 2020-11-15: 500 mL via INTRAVENOUS

## 2020-11-15 NOTE — Progress Notes (Signed)
11/15/20 0206   Critical value received: Troponin 301  Dr. Cyd Silence notified, no new orders at this time.

## 2020-11-15 NOTE — Progress Notes (Signed)
Progress Note  Patient Name: Stefanie Houston Date of Encounter: 11/15/2020  Primary Cardiologist: No primary care provider on file.  Chandrasekhar- New  Subjective   Overnight BP was low 88/57.  Patient returned to [place] with 1st HB overnight with return to new tachycardia (suspect SVT with aberrancy ~ 3am).   Nurse notes significant vomiting over night.  No chest pain or pressure .  No abdominal pain.  Notes SOB; no PND/Orthopnea.  No weight gain or leg swelling.  No palpitations with the return of tachycardia  Inpatient Medications    Scheduled Meds: . aspirin EC  81 mg Oral Daily  . atorvastatin  20 mg Oral Daily  . enoxaparin (LOVENOX) injection  30 mg Subcutaneous Daily  . insulin aspart  0-15 Units Subcutaneous TID WC  . insulin aspart  4 Units Subcutaneous TID WC  . insulin glargine  15 Units Subcutaneous QHS  . pantoprazole  40 mg Oral Daily  . psyllium  1 packet Oral Daily   Continuous Infusions: . cefTRIAXone (ROCEPHIN)  IV 1 g (11/15/20 0144)  . lactated ringers with kcl 100 mL/hr at 11/15/20 0149  . sodium chloride     PRN Meds: acetaminophen **OR** acetaminophen, hydrOXYzine, ondansetron **OR** ondansetron (ZOFRAN) IV, polyethylene glycol, prochlorperazine   Vital Signs    Vitals:   11/15/20 0219 11/15/20 0226 11/15/20 0336 11/15/20 0841  BP:   119/63 (!) 88/57  Pulse:      Resp: 18 19 17 19   Temp:    98.7 F (37.1 C)  TempSrc:    Axillary  SpO2:    98%  Weight:      Height:        Intake/Output Summary (Last 24 hours) at 11/15/2020 0951 Last data filed at 11/14/2020 2144 Gross per 24 hour  Intake 2100 ml  Output -  Net 2100 ml   Filed Weights   11/12/20 1929  Weight: 99.8 kg    Telemetry    SR with 1st HB ~ 2 am with return to [facility] ~ 3 AM - Personally Reviewed  ECG    WCT rate 107, morphological LBBB (11/15/20) Similar but wider than 07/25/2020 - Personally Reviewed  Physical Exam   GEN: Somnolent but rousable   Neck: No JVD (thick neck,  difficult exam) Ears: notable Frank's Sign Cardiac: regular tachycardia, no murmurs, rubs, or gallops.  Respiratory: Clear to auscultation bilaterally but with poor respiratory effort. GI: Soft, nontender, non-distended, minimal bowel sounds MS: No edema, pain with deep palptations; No deformity. Neuro:  Nonfocal  Psych: Somnolent affect   Labs    Chemistry Recent Labs  Lab 11/14/20 0950 11/14/20 2008 11/15/20 0101  NA 133* 133* 132*  K 3.6 3.4* 3.9  CL 100 98 101  CO2 20* 22 17*  GLUCOSE 365* 254* 395*  BUN 23 30* 29*  CREATININE 1.57* 2.45* 2.21*  CALCIUM 8.6* 8.8* 7.7*  PROT  --   --  6.7  ALBUMIN  --   --  2.8*  AST  --   --  37  ALT  --   --  29  ALKPHOS  --   --  54  BILITOT  --   --  1.1  GFRNONAA 34* 20* 22*  ANIONGAP 13 13 14      Hematology Recent Labs  Lab 11/12/20 1804 11/12/20 1903 11/14/20 2008 11/15/20 0101  WBC 8.3  --  14.5* 14.9*  RBC 4.54  --  4.64 4.31  HGB 13.5 13.6 14.0 13.2  HCT 41.6 40.0 43.1 40.1  MCV 91.6  --  92.9 93.0  MCH 29.7  --  30.2 30.6  MCHC 32.5  --  32.5 32.9  RDW 12.4  --  12.7 12.9  PLT 247  --  221 198    Cardiac EnzymesNo results for input(s): TROPONINI in the last 168 hours. No results for input(s): TROPIPOC in the last 168 hours.   BNP Recent Labs  Lab 11/15/20 0101  BNP 73.3     DDimer No results for input(s): DDIMER in the last 168 hours.   Radiology    DG Chest Portable 1 View  Result Date: 11/14/2020 CLINICAL DATA:  Chest pain. EXAM: PORTABLE CHEST 1 VIEW COMPARISON:  September 27, 2018. FINDINGS: Stable cardiomediastinal silhouette. No pneumothorax or pleural effusion is noted. Both lungs are clear. The visualized skeletal structures are unremarkable. IMPRESSION: No active disease. Electronically Signed   By: Marijo Conception M.D.   On: 11/14/2020 21:55   DG Abd Portable 2V  Result Date: 11/15/2020 CLINICAL DATA:  Bowel obstruction, vomiting EXAM: PORTABLE ABDOMEN - 2 VIEW COMPARISON:  None. FINDINGS:  Lungs are clear. Lung apices are partially obscured, however, no definite pneumothorax. No pleural effusion. Cardiac size within normal limits. Pulmonary vascularity is normal. Surgical clips are seen within the left axilla. Normal abdominal gas pattern. No free intraperitoneal gas. Moderate stool within the distal colon. Inferior pelvis is partially excluded. No organomegaly. Vascular calcifications noted within the pelvis. IMPRESSION: Normal abdominal gas pattern.  Moderate stool. Electronically Signed   By: Fidela Salisbury MD   On: 11/15/2020 02:43    Cardiac Studies   Echo still pending (not performed)  Patient Profile     80 y.o. female Morbid Obesity, Hallucinations and Delusions with AKI, Hypotension, and Tachycardia  Assessment & Plan    Hypotension with Lactic Acidosis AKI - with normal BNP - reasonable for IVF in the setting of AKI - discussed with primary and will start with 500 cc IVF - getting Echocardiogram  Query of NSTEMI - asymptomatic - anatomy: unknown - changed ASA 81 mg - continue statin - will hold BB in the setting of SBP < 90, lethargy, and lactic acidosis - Echocardiogram Ideally in the setting of her AKI, AMS, hyperglycemia, we can save urgent LHC - will repeat troponin - if significant elevation in troponin form prior (increase to ~ 1000) will likely start heparin as there is presently no contraindication   Palpitations; possible SVT with aberrancy LBBB - hold BB for BP as above - patients BP changes to do not correlate with HR changes, this is likely reactive to her other sequelae of disease; will add back BB when BP will tolerate  Discussed with nursing and primary MD  For questions or updates, please contact [facility] Please consult [email or web address] for contact info under Cardiology/STEMI.      Signed, Werner Lean, MD  11/15/2020, 9:51 AM

## 2020-11-15 NOTE — Progress Notes (Deleted)
  Amiodarone Drug - Drug Interaction Consult Note  Recommendations: No changes to current regimen. Risk of adverse events appear low but will continue to monitor.   Amiodarone is metabolized by the cytochrome P450 system and therefore has the potential to cause many drug interactions. Amiodarone has an average plasma half-life of 50 days (range 20 to 100 days).   There is potential for drug interactions to occur several weeks or months after stopping treatment and the onset of drug interactions may be slow after initiating amiodarone.   [x]  Statins: Increased risk of myopathy. Simvastatin- restrict dose to 20mg  daily. Other statins: counsel patients to report any muscle pain or weakness immediately.  [x]  Anticoagulants: Amiodarone can increase anticoagulant effect. Consider warfarin dose reduction. Patients should be monitored closely and the dose of anticoagulant altered accordingly, remembering that amiodarone levels take several weeks to stabilize.  []  Antiepileptics: Amiodarone can increase plasma concentration of phenytoin, the dose should be reduced. Note that small changes in phenytoin dose can result in large changes in levels. Monitor patient and counsel on signs of toxicity.  []  Beta blockers: increased risk of bradycardia, AV block and myocardial depression. Sotalol - avoid concomitant use.  []   Calcium channel blockers (diltiazem and verapamil): increased risk of bradycardia, AV block and myocardial depression.  []   Cyclosporine: Amiodarone increases levels of cyclosporine. Reduced dose of cyclosporine is recommended.  []  Digoxin dose should be halved when amiodarone is started.  []  Diuretics: increased risk of cardiotoxicity if hypokalemia occurs.  []  Oral hypoglycemic agents (glyburide, glipizide, glimepiride): increased risk of hypoglycemia. Patient's glucose levels should be monitored closely when initiating amiodarone therapy.   []  Drugs that prolong the QT interval:   Torsades de pointes risk may be increased with concurrent use - avoid if possible.  Monitor QTc, also keep magnesium/potassium WNL if concurrent therapy can't be avoided. Marland Kitchen Antibiotics: e.g. fluoroquinolones, erythromycin. . Antiarrhythmics: e.g. quinidine, procainamide, disopyramide, sotalol. . Antipsychotics: e.g. phenothiazines, haloperidol.  . Lithium, tricyclic antidepressants, and methadone.  Thank You,   Claudina Lick, PharmD PGY1 Acute Care Pharmacy Resident 11/15/2020 9:50 AM   Please check [email or web address] for unit-specific pharmacy phone numbers.

## 2020-11-15 NOTE — Progress Notes (Addendum)
PROGRESS NOTE    Stefanie Houston  EQA:[id number] DOB: 1941-06-06 DOA: 11/12/2020 PCP: Rudene Anda, MD   Brief Narrative:  80 year old female with past history of diabetes mellitus type 2, hypertension, hyperlipidemia, gastroesophageal reflux disease, meningioma, coronary artery disease (S/P cath 2012 with stent placement), who initially presented to [facility] emergency department via EMS due to concerns for the husband for progressive paranoid delusions.  Upon initial evaluation of the patient on 2/24, patient was found to be hypoglycemic and initially refused insulin due to reported insulin allergy.  Other than that, patient continued to exhibit delusions of paranoia as mentioned above.  Behavioral health was contacted and arrangements were made for the patient to eventually dispositioned for further psychiatric care.  While patient continued to wait for psychiatric bed for approximately 48 hours unfortunately on 2/26, the patient began to exhibit progressively worsening tachycardia with intermittent complaints of chest discomfort and shortness of breath. Tachycardia did not seem to improve after initiation of 10 mg of IV diltiazem and 150 mg of amiodarone.    Cardiology consulted by ED. Repeat blood work revealed patient was developing acute kidney injury with a creatinine of 2.45.  Patient started to become increasingly hypotensive with systolic blood pressures occasionally in the 80s and blood work also revealed patient was developing a leukocytosis of 14.5 suggesting possible developing infection and she was diagnosed with UTI and subsequently admitted to hospital service.  Assessment & Plan:   Principal Problem:   Sepsis due to gram-negative UTI ([place]) Active Problems:   Wide-complex tachycardia (HCC)   Delusion ([place])   Essential hypertension   Mixed diabetic hyperlipidemia associated with type 2 diabetes mellitus (HCC)   Chest pain   Acute renal failure superimposed on stage 3a  chronic kidney disease (HCC)   Dehydration with hyponatremia   Coronary artery disease involving native coronary artery of native heart   GERD without esophagitis   Lactic acidosis   Hypomagnesemia   Uncontrolled type 2 diabetes mellitus with hyperglycemia, without long-term current use of insulin (HCC)   Severe Sepsis secondary to UTI: Patient met sepsis criteria based on leukocytosis, tachycardia and tachypnea and lactic acid of 2.8.  All cultures negative thus far.  Follow them.  Continue Rocephin in the meantime.  History of CAD/chest pain/elevated troponin/possible SVT with aberrancy: patient has a known history of coronary artery disease status post cardiac catheterization and stent placement 2012. Patient did complain of some chest pain at the time of admission.  No pain today.  She did have some vomiting last night along with some hypotension.  There was some question about possible wide-complex QRS tachycardia as well.  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  Beta-blocker stopped by cardiology. Echo pending.  Continue statin and aspirin.  Uncontrolled type 2 diabetes mellitus with hyperglycemia: Reportedly patient was not taking any of her insulin and she believes that she is allergic to insulin.  This was verified with patient's PCP by ED physician and it was clarified that patient is not allergic to insulin any longer.  Patient remains hyperglycemic.  Will increase Lantus to 15 units and continue SSI.  AKI on CKD stage IIIa/dehydration: Patient's baseline creatinine seems to be around 1.3.  She was admitted with creatinine 2.45 which has improved a little bit to 2.2 today.  This is likely due to combination of UTI and dehydration.  Continue gentle hydration.  Avoid nephrotoxic agents.  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  Essential hypertension:  All anti-hypertensives on hold due to intermittent hypotension.  Blood pressure  now much better than earlier.  Monitor closely.  GERD without esophagitis: Continue PPI.  DVT prophylaxis: enoxaparin (LOVENOX) injection 30 mg Start: 11/15/20 1000   Code Status: Full Code  Family Communication:  None present at bedside.  Discussed with husband over the phone  Status is: Inpatient  Remains inpatient appropriate because:Inpatient level of care appropriate due to severity of illness   Dispo: The patient is from: Home              Anticipated d/c is to: [facility]              Patient currently is not medically stable to d/c.   Difficult to place patient No        Estimated body mass index is 40.24 kg/m as calculated from the following:   Height as of this encounter: 5' 2" (1.575 m).   Weight as of this encounter: 99.8 kg.      Nutritional status:               Consultants:   Cardiology  Procedures:   None  Antimicrobials:  Anti-infectives (From admission, onward)   Start     Dose/Rate Route Frequency Ordered Stop   11/14/20 2300  cefTRIAXone (ROCEPHIN) 1 g in sodium chloride 0.9 % 100 mL IVPB        1 g 200 mL/hr over 30 Minutes Intravenous Every 24 hours 11/14/20 2257           Subjective: Patient seen and examined.  Sleepy but easily arousable.  Irritable.  Does not want to talk.  Denied having any complaint.  Objective: Vitals:   11/15/20 0226 11/15/20 0336 11/15/20 0841 11/15/20 0903  BP:  119/63 (!) 88/57 (!) 103/59  Pulse:    (!) 104  Resp: _0 Temp:   98.7 F (37.1 C) 98.4 F (36.9 C)  TempSrc:   Axillary   SpO2:   98% 98%  Weight:      Height:        Intake/Output Summary (Last 24 hours) at 11/15/2020 1433 Last data filed at 11/14/2020 2144 Gross per 24 hour  Intake 2100 ml  Output --  Net 2100 ml   Filed Weights   11/12/20 1929  Weight: 99.8 kg    Examination:  General exam: Appears calm and comfortable  Respiratory system: Clear to auscultation. Respiratory effort normal. Cardiovascular system:  S1 & S2 heard, RRR. No JVD, murmurs, rubs, gallops or clicks. No pedal edema. Gastrointestinal system: Abdomen is nondistended, soft and nontender. No organomegaly or masses felt. Normal bowel sounds heard. Central nervous system: Sleepy but arousable.  Unable to assess orientation.  Moving all extremities spontaneously. Extremities: Symmetric 5 x 5 power.   Data Reviewed: I have personally reviewed following labs and imaging studies  CBC: Recent Labs  Lab 11/12/20 1804 11/12/20 1903 11/14/20 2008 11/15/20 0101  WBC 8.3  --  14.5* 14.9*  NEUTROABS  --   --   --  12.7*  HGB 13.5 13.6 14.0 13.2  HCT 41.6 40.0 43.1 40.1  MCV 91.6  --  92.9 93.0  PLT 247  --  221 016   Basic Metabolic Panel: Recent Labs  Lab 11/12/20 1804 11/12/20 1903 11/13/20 2250 11/14/20 0950 11/14/20 2008 11/14/20 2156 11/15/20 0101  NA 133* 134* 131* 133* 133*  --  132*  K 4.0 4.3 5.6* 3.6 3.4*  --  3.9  CL 99  --  99 100 98  --  101  CO2 23  --  20* 20* 22  --  17*  GLUCOSE 400*  --  356* 365* 254*  --  395*  BUN 16  --  19 23 30*  --  29*  CREATININE 1.26*  --  1.23* 1.57* 2.45*  --  2.21*  CALCIUM 9.5  --  8.9 8.6* 8.8*  --  7.7*  MG  --   --   --   --   --  1.4* 1.7   GFR: Estimated Creatinine Clearance: 23.2 mL/min (A) (by C-G formula based on SCr of 2.21 mg/dL (H)). Liver Function Tests: Recent Labs  Lab 11/15/20 0101  AST 37  ALT 29  ALKPHOS 54  BILITOT 1.1  PROT 6.7  ALBUMIN 2.8*   No results for input(s): LIPASE, AMYLASE in the last 168 hours. No results for input(s): AMMONIA in the last 168 hours. Coagulation Profile: Recent Labs  Lab 11/15/20 0101  INR 1.1   Cardiac Enzymes: No results for input(s): CKTOTAL, CKMB, CKMBINDEX, TROPONINI in the last 168 hours. BNP (last 3 results) No results for input(s): PROBNP in the last 8760 hours. HbA1C: Recent Labs    11/12/20 1908  HGBA1C 14.5*   CBG: Recent Labs  Lab 11/14/20 1722 11/15/20 0021 11/15/20 0745  11/15/20 1048 11/15/20 1158  GLUCAP 234* 314* 431* 307* 307*   Lipid Profile: Recent Labs    11/13/20 2250 11/14/20 0327  CHOL 197 184  HDL 37* 33*  LDLCALC 122* 115*  TRIG 190* 181*  CHOLHDL 5.3 5.6   Thyroid Function Tests: Recent Labs    11/15/20 0101  TSH 1.138   Anemia Panel: Recent Labs    11/15/20 0101  VITAMINB12 184  FOLATE 7.1   Sepsis Labs: Recent Labs  Lab 11/14/20 2124 11/15/20 0101  PROCALCITON  --  <0.10  LATICACIDVEN 2.8* 2.4*    Recent Results (from the past 240 hour(s))  Resp Panel by RT-PCR (Flu A&B, Covid) Nasopharyngeal Swab     Status: None   Collection Time: 11/14/20 10:21 PM   Specimen: Nasopharyngeal Swab; Nasopharyngeal(NP) swabs in vial transport medium  Result Value Ref Range Status   SARS Coronavirus 2 by RT PCR NEGATIVE NEGATIVE Final    Comment: (NOTE) SARS-CoV-2 target nucleic acids are NOT DETECTED.  The SARS-CoV-2 RNA is generally detectable in upper respiratory specimens during the acute phase of infection. The lowest concentration of SARS-CoV-2 viral copies this assay can detect is 138 copies/mL. A negative result does not preclude SARS-Cov-2 infection and should not be used as the sole basis for treatment or other patient management decisions. A negative result may occur with  improper specimen collection/handling, submission of specimen other than nasopharyngeal swab, presence of viral mutation(s) within the areas targeted by this assay, and inadequate number of viral copies(<138 copies/mL). A negative result must be combined with clinical observations, patient history, and epidemiological information. The expected result is Negative.  Fact Sheet for Patients:  EntrepreneurPulse.com.au  Fact Sheet for Healthcare Providers:  IncredibleEmployment.be  This test is no t yet approved or cleared by the [place] FDA and  has been authorized for detection and/or diagnosis of  SARS-CoV-2 by FDA under an Emergency Use Authorization (EUA). This EUA will remain  in effect (meaning this test can be used) for the duration of the COVID-19 declaration under Section 564(b)(1) of the Act, 21 U.S.C.section 360bbb-3(b)(1), unless the authorization is terminated  or revoked sooner.  Influenza A by PCR NEGATIVE NEGATIVE Final   Influenza B by PCR NEGATIVE NEGATIVE Final    Comment: (NOTE) The Xpert Xpress SARS-CoV-2/FLU/RSV plus assay is intended as an aid in the diagnosis of influenza from Nasopharyngeal swab specimens and should not be used as a sole basis for treatment. Nasal washings and aspirates are unacceptable for Xpert Xpress SARS-CoV-2/FLU/RSV testing.  Fact Sheet for Patients: EntrepreneurPulse.com.au  Fact Sheet for Healthcare Providers: IncredibleEmployment.be  This test is not yet approved or cleared by the [place] FDA and has been authorized for detection and/or diagnosis of SARS-CoV-2 by FDA under an Emergency Use Authorization (EUA). This EUA will remain in effect (meaning this test can be used) for the duration of the COVID-19 declaration under Section 564(b)(1) of the Act, 21 U.S.C. section 360bbb-3(b)(1), unless the authorization is terminated or revoked.  Performed at [facility], [facility] [address], [place], [place] [address]       Radiology Studies: CT ABDOMEN PELVIS WO CONTRAST  Result Date: 11/15/2020 CLINICAL DATA:  80 year old female with history of renal failure and sepsis. Pyelonephritis. EXAM: CT ABDOMEN AND PELVIS WITHOUT CONTRAST TECHNIQUE: Multidetector CT imaging of the abdomen and pelvis was performed following the standard protocol without IV contrast. COMPARISON:  CT the abdomen and pelvis 07/25/2020. FINDINGS: Lower chest: Small right pleural effusion lying dependently. Atherosclerotic calcifications in the descending thoracic aorta as well as the left anterior descending,  left circumflex and right coronary arteries. Calcifications of the aortic valve. Hepatobiliary: Heterogeneous areas of low attenuation are noted throughout the hepatic parenchyma, indicative of heterogeneous hepatic steatosis. Large 3.7 x 3.1 cm low-attenuation lesion in the left lobe of the liver, previously characterized as a simple cyst. No other definite suspicious hepatic lesions are noted on today's noncontrast CT examination. Amorphous high attenuation material lying dependently in the gallbladder which may reflect biliary sludge and/or innumerable tiny calcified gallstones. Gallbladder is not distended. No gallbladder wall thickening. Trace amount of pericholecystic fluid, without overt surrounding inflammatory changes. Pancreas: No definite pancreatic mass or peripancreatic fluid collections or inflammatory changes noted on today's noncontrast CT examination. Spleen: Unremarkable. Adrenals/Urinary Tract: Unenhanced appearance of the kidneys and bilateral adrenal glands is unremarkable. No hydroureteronephrosis. Urinary bladder is normal in appearance. Stomach/Bowel: Unenhanced appearance of the stomach is normal. No pathologic dilatation of small bowel or colon. Numerous colonic diverticulae are noted, without surrounding inflammatory changes to suggest an acute diverticulitis at this time. The appendix is not confidently identified and may be surgically absent. Regardless, there are no inflammatory changes noted adjacent to the cecum to suggest the presence of an acute appendicitis at this time. Vascular/Lymphatic: Aortic atherosclerosis. No lymphadenopathy noted in the abdomen or pelvis. Reproductive: Uterus and ovaries are unremarkable in appearance. Other: Trace volume of ascites.  No pneumoperitoneum. Musculoskeletal: No aggressive appearing lytic or blastic lesions are noted in the visualized portions of the skeleton. IMPRESSION: 1. Biliary sludge and/or tiny calcified gallstones lying dependently in  the gallbladder. Gallbladder is not distended and there is no gallbladder wall thickening. There is a trace amount of pericholecystic fluid, however, there are no overt surrounding inflammatory changes. If there is clinical concern for acute cholecystitis, further evaluation with right upper quadrant ultrasound should be considered. 2. Trace volume of ascites. 3. No other acute findings are noted in the abdomen or pelvis. 4. Colonic diverticulosis without evidence of acute diverticulitis at this time. 5. Small right pleural effusion lying dependently. 6. Aortic atherosclerosis, in addition to 3 vessel coronary artery disease. Assessment  for potential risk factor modification, dietary therapy or pharmacologic therapy may be warranted, if clinically indicated. 7. There are calcifications of the aortic valve. Echocardiographic correlation for evaluation of potential valvular dysfunction may be warranted if clinically indicated. 8. Additional incidental findings, as above. Electronically Signed   By: Vinnie Langton M.D.   On: 11/15/2020 14:19   DG Chest Portable 1 View  Result Date: 11/14/2020 CLINICAL DATA:  Chest pain. EXAM: PORTABLE CHEST 1 VIEW COMPARISON:  September 27, 2018. FINDINGS: Stable cardiomediastinal silhouette. No pneumothorax or pleural effusion is noted. Both lungs are clear. The visualized skeletal structures are unremarkable. IMPRESSION: No active disease. Electronically Signed   By: Marijo Conception M.D.   On: 11/14/2020 21:55   DG Abd Portable 2V  Result Date: 11/15/2020 CLINICAL DATA:  Bowel obstruction, vomiting EXAM: PORTABLE ABDOMEN - 2 VIEW COMPARISON:  None. FINDINGS: Lungs are clear. Lung apices are partially obscured, however, no definite pneumothorax. No pleural effusion. Cardiac size within normal limits. Pulmonary vascularity is normal. Surgical clips are seen within the left axilla. Normal abdominal gas pattern. No free intraperitoneal gas. Moderate stool within the distal colon.  Inferior pelvis is partially excluded. No organomegaly. Vascular calcifications noted within the pelvis. IMPRESSION: Normal abdominal gas pattern.  Moderate stool. Electronically Signed   By: Fidela Salisbury MD   On: 11/15/2020 02:43    Scheduled Meds: . aspirin EC  81 mg Oral Daily  . atorvastatin  20 mg Oral Daily  . enoxaparin (LOVENOX) injection  30 mg Subcutaneous Daily  . insulin aspart  0-15 Units Subcutaneous TID WC  . insulin aspart  4 Units Subcutaneous TID WC  . insulin glargine  15 Units Subcutaneous QHS  . pantoprazole  40 mg Oral Daily  . psyllium  1 packet Oral Daily   Continuous Infusions: . cefTRIAXone (ROCEPHIN)  IV 1 g (11/15/20 0144)  . lactated ringers with kcl 100 mL/hr at 11/15/20 0149     LOS: 1 day   Time spent: 35 minutes   Darliss Cheney, MD [facility]  11/15/2020, 2:33 PM   To contact the attending provider between 7A-7P or the covering provider during after hours 7P-7A, please log into the web site [email or web address].

## 2020-11-15 NOTE — Progress Notes (Incomplete)
Pt CBG 431 amion Dr. Doristine Bosworth, [place] he just wanted the 15 u from sliding scale. Pt had a decrease in activity all day because she had a very eventful night. She refused her meals and just wanted to sleep during shift was able to get pt to drink a few cup of water during shift.  Husband came up to bring a purse and something's he thought pt may need. Nurse asked him to make sure that he didn't leave nothing of value or monye in pure.

## 2020-11-15 NOTE — Consult Note (Signed)
[facility] Nurse Consult Note: Reason for Consult: Deep tissue pressure injury POA. Deeply blue hued skin in the coccygeal area, and while blanchable, it does not return to original state. Wound type:Pressure Pressure Injury POA: Yes Measurement: Left: 10cm x 5cm  Right: 8cm x 4cm Wound bed:N/A Drainage (amount, consistency, odor) N/A Periwound: intact, moist with urinary incontinence Dressing procedure/placement/frequency: I will provide patient with a mattress replacement with low air loss feature as well as bilateral pressure redistribution heel boots.  Patient to be turned and repositioned per house protocol. Silicone foam dressings will be used and lifted twice daily to inspect and assess skin.  [facility] nursing team will not follow, but will remain available to this patient, the nursing and medical teams.  Please re-consult if needed. Thanks, Maudie Flakes, MSN, RN, [place], Arther Abbott  Pager# [phone]

## 2020-11-16 ENCOUNTER — Inpatient Hospital Stay (HOSPITAL_COMMUNITY): Payer: Medicare Other

## 2020-11-16 DIAGNOSIS — I361 Nonrheumatic tricuspid (valve) insufficiency: Secondary | ICD-10-CM

## 2020-11-16 DIAGNOSIS — I340 Nonrheumatic mitral (valve) insufficiency: Secondary | ICD-10-CM | POA: Diagnosis not present

## 2020-11-16 DIAGNOSIS — N390 Urinary tract infection, site not specified: Secondary | ICD-10-CM | POA: Diagnosis not present

## 2020-11-16 DIAGNOSIS — A4150 Gram-negative sepsis, unspecified: Secondary | ICD-10-CM | POA: Diagnosis not present

## 2020-11-16 DIAGNOSIS — R079 Chest pain, unspecified: Secondary | ICD-10-CM | POA: Diagnosis not present

## 2020-11-16 DIAGNOSIS — I214 Non-ST elevation (NSTEMI) myocardial infarction: Secondary | ICD-10-CM

## 2020-11-16 LAB — COMPREHENSIVE METABOLIC PANEL
ALT: 23 U/L (ref 0–44)
AST: 23 U/L (ref 15–41)
Albumin: 2.5 g/dL — ABNORMAL LOW (ref 3.5–5.0)
Alkaline Phosphatase: 42 U/L (ref 38–126)
Anion gap: 10 (ref 5–15)
BUN: 28 mg/dL — ABNORMAL HIGH (ref 8–23)
CO2: 22 mmol/L (ref 22–32)
Calcium: 8.2 mg/dL — ABNORMAL LOW (ref 8.9–10.3)
Chloride: 107 mmol/L (ref 98–111)
Creatinine, Ser: 1.54 mg/dL — ABNORMAL HIGH (ref 0.44–1.00)
GFR, Estimated: 34 mL/min — ABNORMAL LOW (ref >60)
Glucose, Bld: 208 mg/dL — ABNORMAL HIGH (ref 70–99)
Potassium: 4.3 mmol/L (ref 3.5–5.1)
Sodium: 139 mmol/L (ref 135–145)
Total Bilirubin: 0.6 mg/dL (ref 0.3–1.2)
Total Protein: 5.9 g/dL — ABNORMAL LOW (ref 6.5–8.1)

## 2020-11-16 LAB — ECHOCARDIOGRAM COMPLETE
Area-P 1/2: 8.25 cm2
Height: 62 in
S' Lateral: 3 cm
Single Plane A4C EF: 47.4 %
Weight: 3520.31 oz

## 2020-11-16 LAB — CBC WITH DIFFERENTIAL/PLATELET
Abs Immature Granulocytes: 0.06 10*3/uL (ref 0.00–0.07)
Basophils Absolute: 0.1 10*3/uL (ref 0.0–0.1)
Basophils Relative: 1 %
Eosinophils Absolute: 0.1 10*3/uL (ref 0.0–0.5)
Eosinophils Relative: 1 %
HCT: 36.3 % (ref 36.0–46.0)
Hemoglobin: 11.7 g/dL — ABNORMAL LOW (ref 12.0–15.0)
Immature Granulocytes: 1 %
Lymphocytes Relative: 28 %
Lymphs Abs: 3.1 10*3/uL (ref 0.7–4.0)
MCH: 30.2 pg (ref 26.0–34.0)
MCHC: 32.2 g/dL (ref 30.0–36.0)
MCV: 93.8 fL (ref 80.0–100.0)
Monocytes Absolute: 1.2 10*3/uL — ABNORMAL HIGH (ref 0.1–1.0)
Monocytes Relative: 11 %
Neutro Abs: 6.4 10*3/uL (ref 1.7–7.7)
Neutrophils Relative %: 58 %
Platelets: 157 10*3/uL (ref 150–400)
RBC: 3.87 MIL/uL (ref 3.87–5.11)
RDW: 13 % (ref 11.5–15.5)
WBC: 10.9 10*3/uL — ABNORMAL HIGH (ref 4.0–10.5)
nRBC: 0 % (ref 0.0–0.2)

## 2020-11-16 LAB — GLUCOSE, CAPILLARY
Glucose-Capillary: 178 mg/dL — ABNORMAL HIGH (ref 70–99)
Glucose-Capillary: 162 mg/dL — ABNORMAL HIGH (ref 70–99)
Glucose-Capillary: 167 mg/dL — ABNORMAL HIGH (ref 70–99)
Glucose-Capillary: 138 mg/dL — ABNORMAL HIGH (ref 70–99)

## 2020-11-16 LAB — LACTIC ACID, PLASMA
Lactic Acid, Venous: 1.4 mmol/L (ref 0.5–1.9)
Lactic Acid, Venous: 1.7 mmol/L (ref 0.5–1.9)

## 2020-11-16 MED ORDER — INSULIN GLARGINE 100 UNIT/ML ~~LOC~~ SOLN
20.0000 [IU] | Freq: Every day | SUBCUTANEOUS | Status: DC
  Administered 2020-11-16 – 2020-11-17 (×2): 20 [IU] via SUBCUTANEOUS
  Filled 2020-11-16 (×3): qty 0.2

## 2020-11-16 NOTE — Plan of Care (Signed)
  Problem: Clinical Measurements: Goal: Respiratory complications will improve Outcome: Progressing   Problem: Clinical Measurements: Goal: Cardiovascular complication will be avoided Outcome: Progressing   Problem: Safety: Goal: Ability to remain free from injury will improve Outcome: Progressing   

## 2020-11-16 NOTE — Discharge Planning (Signed)
Pt PCP office called stating pt was prescribed insulin in the past (2018) and did not have an adverse reaction at that time.

## 2020-11-16 NOTE — Plan of Care (Signed)
  Problem: Education: Goal: Knowledge of General Education information will improve Description: Including pain rating scale, medication(s)/side effects and non-pharmacologic comfort measures Outcome: Progressing   Problem: Clinical Measurements: Goal: Respiratory complications will improve Outcome: Progressing   Problem: Clinical Measurements: Goal: Cardiovascular complication will be avoided Outcome: Progressing   Problem: Nutrition: Goal: Adequate nutrition will be maintained Outcome: Progressing   Problem: Pain Managment: Goal: General experience of comfort will improve Outcome: Progressing   Problem: Safety: Goal: Ability to remain free from injury will improve Outcome: Progressing   

## 2020-11-16 NOTE — Progress Notes (Signed)
Progress Note  Patient Name: Stefanie Houston Date of Encounter: 11/16/2020  [facility] Cardiologist: Werner Lean, MD   Subjective   No cardiac complaints.  She has history of heart disease with stenting 2013 in [place].  Currently denies chest pain.  Somewhat short of breath compared with baseline according to the patient.  Inpatient Medications    Scheduled Meds: . aspirin EC  81 mg Oral Daily  . atorvastatin  20 mg Oral Daily  . enoxaparin (LOVENOX) injection  30 mg Subcutaneous Daily  . insulin aspart  0-15 Units Subcutaneous TID WC  . insulin aspart  4 Units Subcutaneous TID WC  . insulin glargine  15 Units Subcutaneous QHS  . pantoprazole  40 mg Oral Daily  . psyllium  1 packet Oral Daily   Continuous Infusions: . cefTRIAXone (ROCEPHIN)  IV 1 g (11/15/20 2307)   PRN Meds: acetaminophen **OR** acetaminophen, hydrOXYzine, ondansetron **OR** ondansetron (ZOFRAN) IV, polyethylene glycol, prochlorperazine   Vital Signs    Vitals:   11/15/20 1859 11/15/20 2316 11/16/20 0302 11/16/20 0745  BP: 110/64 135/69 126/65 136/68  Pulse: (!) 102 96 87 88  Resp: 18 20 18  (!) 21  Temp: 97.9 F (36.6 C) 98.8 F (37.1 C) 98.1 F (36.7 C) 98.6 F (37 C)  TempSrc:    Oral  SpO2: 95% 99% 98% 100%  Weight:      Height:        Intake/Output Summary (Last 24 hours) at 11/16/2020 0923 Last data filed at 11/16/2020 0604 Gross per 24 hour  Intake 0 ml  Output 850 ml  Net -850 ml   Last 3 Weights 11/12/2020 07/25/2020  Weight (lbs) 220 lb 0.3 oz 220 lb  Weight (kg) 99.8 kg 99.791 kg      Telemetry    Sinus rhythm with occasional PVCs.  PVCs have same characteristics/morphology as the LAD complex AIVR noted below.- Personally Reviewed  ECG    Performed on November 15, 2020 demonstrates a heart rate of 107 bpm, no P waves, wide-complex with atypical left bundle suggesting accelerated idioventricular rhythm.- Personally Reviewed  Physical Exam  Obese, elderly, and  appearing fatigued. GEN: No acute distress.   Neck: No JVD Cardiac: RRR, no murmurs, rubs, however there is an S4 gallop.  Respiratory: Clear to auscultation bilaterally. GI: Soft, nontender, non-distended  MS: Bilateral trace ankle edema Neuro:  Nonfocal  Psych: Normal affect   Labs    High Sensitivity Troponin:   Recent Labs  Lab 11/15/20 0101 11/15/20 1002 11/15/20 1158  TROPONINIHS 301* 415* 361*      Chemistry Recent Labs  Lab 11/14/20 2008 11/15/20 0101 11/16/20 0325  NA 133* 132* 139  K 3.4* 3.9 4.3  CL 98 101 107  CO2 22 17* 22  GLUCOSE 254* 395* 208*  BUN 30* 29* 28*  CREATININE 2.45* 2.21* 1.54*  CALCIUM 8.8* 7.7* 8.2*  PROT  --  6.7 5.9*  ALBUMIN  --  2.8* 2.5*  AST  --  37 23  ALT  --  29 23  ALKPHOS  --  54 42  BILITOT  --  1.1 0.6  GFRNONAA 20* 22* 34*  ANIONGAP 13 14 10      Hematology Recent Labs  Lab 11/14/20 2008 11/15/20 0101 11/16/20 0325  WBC 14.5* 14.9* 10.9*  RBC 4.64 4.31 3.87  HGB 14.0 13.2 11.7*  HCT 43.1 40.1 36.3  MCV 92.9 93.0 93.8  MCH 30.2 30.6 30.2  MCHC 32.5 32.9 32.2  RDW 12.7  12.9 13.0  PLT 221 198 157    BNP Recent Labs  Lab 11/15/20 0101  BNP 73.3     DDimer No results for input(s): DDIMER in the last 168 hours.   Radiology    CT ABDOMEN PELVIS WO CONTRAST  Result Date: 11/15/2020 CLINICAL DATA:  80 year old female with history of renal failure and sepsis. Pyelonephritis. EXAM: CT ABDOMEN AND PELVIS WITHOUT CONTRAST TECHNIQUE: Multidetector CT imaging of the abdomen and pelvis was performed following the standard protocol without IV contrast. COMPARISON:  CT the abdomen and pelvis 07/25/2020. FINDINGS: Lower chest: Small right pleural effusion lying dependently. Atherosclerotic calcifications in the descending thoracic aorta as well as the left anterior descending, left circumflex and right coronary arteries. Calcifications of the aortic valve. Hepatobiliary: Heterogeneous areas of low attenuation are noted  throughout the hepatic parenchyma, indicative of heterogeneous hepatic steatosis. Large 3.7 x 3.1 cm low-attenuation lesion in the left lobe of the liver, previously characterized as a simple cyst. No other definite suspicious hepatic lesions are noted on today's noncontrast CT examination. Amorphous high attenuation material lying dependently in the gallbladder which may reflect biliary sludge and/or innumerable tiny calcified gallstones. Gallbladder is not distended. No gallbladder wall thickening. Trace amount of pericholecystic fluid, without overt surrounding inflammatory changes. Pancreas: No definite pancreatic mass or peripancreatic fluid collections or inflammatory changes noted on today's noncontrast CT examination. Spleen: Unremarkable. Adrenals/Urinary Tract: Unenhanced appearance of the kidneys and bilateral adrenal glands is unremarkable. No hydroureteronephrosis. Urinary bladder is normal in appearance. Stomach/Bowel: Unenhanced appearance of the stomach is normal. No pathologic dilatation of small bowel or colon. Numerous colonic diverticulae are noted, without surrounding inflammatory changes to suggest an acute diverticulitis at this time. The appendix is not confidently identified and may be surgically absent. Regardless, there are no inflammatory changes noted adjacent to the cecum to suggest the presence of an acute appendicitis at this time. Vascular/Lymphatic: Aortic atherosclerosis. No lymphadenopathy noted in the abdomen or pelvis. Reproductive: Uterus and ovaries are unremarkable in appearance. Other: Trace volume of ascites.  No pneumoperitoneum. Musculoskeletal: No aggressive appearing lytic or blastic lesions are noted in the visualized portions of the skeleton. IMPRESSION: 1. Biliary sludge and/or tiny calcified gallstones lying dependently in the gallbladder. Gallbladder is not distended and there is no gallbladder wall thickening. There is a trace amount of pericholecystic fluid,  however, there are no overt surrounding inflammatory changes. If there is clinical concern for acute cholecystitis, further evaluation with right upper quadrant ultrasound should be considered. 2. Trace volume of ascites. 3. No other acute findings are noted in the abdomen or pelvis. 4. Colonic diverticulosis without evidence of acute diverticulitis at this time. 5. Small right pleural effusion lying dependently. 6. Aortic atherosclerosis, in addition to 3 vessel coronary artery disease. Assessment for potential risk factor modification, dietary therapy or pharmacologic therapy may be warranted, if clinically indicated. 7. There are calcifications of the aortic valve. Echocardiographic correlation for evaluation of potential valvular dysfunction may be warranted if clinically indicated. 8. Additional incidental findings, as above. Electronically Signed   By: Vinnie Langton M.D.   On: 11/15/2020 14:19   DG Chest Portable 1 View  Result Date: 11/14/2020 CLINICAL DATA:  Chest pain. EXAM: PORTABLE CHEST 1 VIEW COMPARISON:  September 27, 2018. FINDINGS: Stable cardiomediastinal silhouette. No pneumothorax or pleural effusion is noted. Both lungs are clear. The visualized skeletal structures are unremarkable. IMPRESSION: No active disease. Electronically Signed   By: Marijo Conception M.D.   On: 11/14/2020 21:55  DG Abd Portable 2V  Result Date: 11/15/2020 CLINICAL DATA:  Bowel obstruction, vomiting EXAM: PORTABLE ABDOMEN - 2 VIEW COMPARISON:  None. FINDINGS: Lungs are clear. Lung apices are partially obscured, however, no definite pneumothorax. No pleural effusion. Cardiac size within normal limits. Pulmonary vascularity is normal. Surgical clips are seen within the left axilla. Normal abdominal gas pattern. No free intraperitoneal gas. Moderate stool within the distal colon. Inferior pelvis is partially excluded. No organomegaly. Vascular calcifications noted within the pelvis. IMPRESSION: Normal abdominal gas  pattern.  Moderate stool. Electronically Signed   By: Fidela Salisbury MD   On: 11/15/2020 02:43    Cardiac Studies    2D Doppler echocardiogram has been ordered and is pending.   Remote echocardiogram from 2018 demonstrated normal LV size and function with EF 55 to 60%.  Patient Profile     80 y.o. female Morbid Obesity, baseline left bundle branch block, sepsis due to gram-negative UTI, diabetes mellitus type 2, acute on chronic kidney disease III hallucinations and Delusions, Hypotension, lactic acidosis, and intermittent wide-complex tachycardia (AIVR versus ectopic atrial tachycardia with aberrancy).  Previously followed by Dr. Darral Dash -with history of stenting prior to 2017 but no mention of vessel or type stent.  Assessment & Plan    1. Wide-complex tachycardia, with slow rate suggesting accelerated idioventricular rhythm, possibly related to intercurrent illness and systemic stress.  Rule out ischemically mediated especially given flat elevated troponin high. 2. 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  Multiple troponin I determinations elevated and flat.  Likely myocardial infarction type II related to the stress of sepsis and hypotension. 3. Non-ST elevation myocardial infarction: Type II 4. Gram-negative sepsis secondary to UTI 5. Hypotension and lactic acidosis: Resolved 6. 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 7. 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  As an outpatient was on beta-blocker and ACE inhibitor therapy.  These therapy should be reinstituted once kidney function and BP are stable.  The cardiac issues seem to be provoked by the underlying episode of sepsis.  Unless there is a surprisingly low LVEF, we will recommend conservative management including resumption of protective therapies.  She would  then be referred back to her primary cardiologist after discharge.       For questions or updates, please contact [place] Please consult [email or web address] for contact info under        Signed, Sinclair Grooms, MD  11/16/2020, 9:23 AM

## 2020-11-16 NOTE — Progress Notes (Signed)
  Echocardiogram 2D Echocardiogram has been performed.  Stefanie Houston 11/16/2020, 11:46 AM

## 2020-11-16 NOTE — Progress Notes (Signed)
PROGRESS NOTE    Stefanie Houston  EHM:[id number] DOB: 12-01-1940 DOA: 11/12/2020 PCP: Rudene Anda, MD   Brief Narrative:  80 year old female with past history of diabetes mellitus type 2, hypertension, hyperlipidemia, gastroesophageal reflux disease, meningioma, coronary artery disease (S/P cath 2012 with stent placement), who initially presented to [facility] emergency department via EMS due to concerns for the husband for progressive paranoid delusions.  Upon initial evaluation of the patient on 2/24, patient was found to be hypoglycemic and initially refused insulin due to reported insulin allergy.  Other than that, patient continued to exhibit delusions of paranoia as mentioned above.  Behavioral health was contacted and arrangements were made for the patient to eventually dispositioned for further psychiatric care.  While patient continued to wait for psychiatric bed for approximately 48 hours unfortunately on 2/26, the patient began to exhibit progressively worsening tachycardia with intermittent complaints of chest discomfort and shortness of breath. Tachycardia did not seem to improve after initiation of 10 mg of IV diltiazem and 150 mg of amiodarone.    Cardiology consulted by ED. Repeat blood work revealed patient was developing acute kidney injury with a creatinine of 2.45.  Patient started to become increasingly hypotensive with systolic blood pressures occasionally in the 80s and blood work also revealed patient was developing a leukocytosis of 14.5 suggesting possible developing infection and she was diagnosed with UTI and subsequently admitted to hospital service.  Assessment & Plan:   Principal Problem:   Sepsis due to gram-negative UTI ([place]) Active Problems:   Wide-complex tachycardia (HCC)   Delusion ([place])   Essential hypertension   Mixed diabetic hyperlipidemia associated with type 2 diabetes mellitus (HCC)   Chest pain   Acute renal failure superimposed on stage 3a  chronic kidney disease (HCC)   Dehydration with hyponatremia   Coronary artery disease involving native coronary artery of native heart   GERD without esophagitis   Lactic acidosis   Hypomagnesemia   Uncontrolled type 2 diabetes mellitus with hyperglycemia, without long-term current use of insulin (HCC)   Severe Sepsis secondary to UTI: Patient met sepsis criteria based on leukocytosis, tachycardia and tachypnea and lactic acid of 2.8.  All cultures negative thus far.  Follow them.  Sepsis physiology resolving.  Leukocytosis improved.  She is afebrile.  Continue Rocephin in the meantime.  Cholelithiasis: CT abdomen and pelvis shows gallstones but no signs of cholecystitis.  Patient denied any abdominal pain and she has no tenderness on examination.  Doubt cholecystitis.  No need of ultrasound.  History of CAD/chest pain/elevated troponin/possible SVT with aberrancy: patient has a known history of coronary artery disease status post cardiac catheterization and stent placement 2012. Patient did complain of some chest pain at the time of admission.  No pain today.  She did have some vomiting last night along with some hypotension.  There was some question about possible wide-complex QRS tachycardia as well.  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  Beta-blocker stopped by cardiology. Echo without WMA.  Has grade 1 diastolic dysfunction and normal ejection fraction.  Continue statin and aspirin.  Acute toxic encephalopathy: Secondary to sepsis/UTI.  Patient's mental status has improved.  She is more alert and oriented x2 today.  Uncontrolled type 2 diabetes mellitus with hyperglycemia: Reportedly patient was not taking any of her insulin and she believes that she is allergic to insulin.  This was verified with patient's PCP by ED physician and it was clarified that patient is not allergic to insulin any longer.  Patient remains hyperglycemic.  Will increase Lantus further to 20 units and  continue SSI.  AKI on CKD stage IIIa/dehydration: Patient's baseline creatinine seems to be around 1.3.  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  Essential hypertension: All anti-hypertensives on hold due to intermittent hypotension.  Blood pressure now much better than earlier.  Monitor closely.  GERD without esophagitis: Continue PPI.  DVT prophylaxis: enoxaparin (LOVENOX) injection 30 mg Start: 11/15/20 1000   Code Status: Full Code  Family Communication:  None present at bedside.  Left to voicemails to husband today.  Status is: Inpatient  Remains inpatient appropriate because:Inpatient level of care appropriate due to severity of illness   Dispo: The patient is from: Home              Anticipated d/c is to: [facility], waiting for bed placement.  TOC aware.              Patient currently is medically stable to d/c.   Difficult to place patient No        Estimated body mass index is 40.24 kg/m as calculated from the following:   Height as of this encounter: 5' 2" (1.575 m).   Weight as of this encounter: 99.8 kg.  Pressure Injury 11/15/20 Buttocks Right;Left Deep Tissue Pressure Injury - Purple or maroon localized area of discolored intact skin or blood-filled blister due to damage of underlying soft tissue from pressure and/or shear. Purple blanchable (Active)  11/15/20 0930  Location: Buttocks  Location Orientation: Right;Left  Staging: Deep Tissue Pressure Injury - Purple or maroon localized area of discolored intact skin or blood-filled blister due to damage of underlying soft tissue from pressure and/or shear.  Wound Description (Comments): Purple blanchable  Present on Admission: Yes     Nutritional status:               Consultants:   Cardiology  Procedures:   None  Antimicrobials:   Anti-infectives (From admission, onward)   Start     Dose/Rate Route Frequency Ordered Stop   11/14/20 2300  cefTRIAXone (ROCEPHIN) 1 g in sodium chloride 0.9 % 100 mL IVPB        1 g 200 mL/hr over 30 Minutes Intravenous Every 24 hours 11/14/20 2257           Subjective: Seen and examined.  Much more alert than yesterday however prefers to keep her eyes closed while having conversation.  Oriented x2.  Complains of pain at different sites but no specific complaint.  Objective: Vitals:   11/15/20 2316 11/16/20 0302 11/16/20 0745 11/16/20 1204  BP: 135/69 126/65 136/68 (!) 160/75  Pulse: 96 87 88 100  Resp: 20 18 (!) 21 17  Temp: 98.8 F (37.1 C) 98.1 F (36.7 C) 98.6 F (37 C) 98.6 F (37 C)  TempSrc:   Oral Oral  SpO2: 99% 98% 100% 99%  Weight:      Height:        Intake/Output Summary (Last 24 hours) at 11/16/2020 1437 Last data filed at 11/16/2020 0604 Gross per 24 hour  Intake --  Output 850 ml  Net -850 ml   Filed Weights   11/12/20 1929  Weight: 99.8 kg    Examination:  General exam: Appears calm and comfortable  Respiratory system: Clear to auscultation. Respiratory effort normal. Cardiovascular system: S1 & S2 heard,  RRR. No JVD, murmurs, rubs, gallops or clicks. No pedal edema. Gastrointestinal system: Abdomen is nondistended, soft and nontender. No organomegaly or masses felt. Normal bowel sounds heard. Central nervous system: Alert and oriented x2. No focal neurological deficits. Extremities: Symmetric 5 x 5 power. Skin: No rashes, lesions or ulcers.  Psychiatry: Judgement and insight appear poor   Data Reviewed: I have personally reviewed following labs and imaging studies  CBC: Recent Labs  Lab 11/12/20 1804 11/12/20 1903 11/14/20 2008 11/15/20 0101 11/16/20 0325  WBC 8.3  --  14.5* 14.9* 10.9*  NEUTROABS  --   --   --  12.7* 6.4  HGB 13.5 13.6 14.0 13.2 11.7*  HCT 41.6 40.0 43.1 40.1 36.3  MCV 91.6  --  92.9 93.0 93.8  PLT 247  --  221  198 735   Basic Metabolic Panel: Recent Labs  Lab 11/13/20 2250 11/14/20 0950 11/14/20 2008 11/14/20 2156 11/15/20 0101 11/16/20 0325  NA 131* 133* 133*  --  132* 139  K 5.6* 3.6 3.4*  --  3.9 4.3  CL 99 100 98  --  101 107  CO2 20* 20* 22  --  17* 22  GLUCOSE 356* 365* 254*  --  395* 208*  BUN 19 23 30*  --  29* 28*  CREATININE 1.23* 1.57* 2.45*  --  2.21* 1.54*  CALCIUM 8.9 8.6* 8.8*  --  7.7* 8.2*  MG  --   --   --  1.4* 1.7  --    GFR: Estimated Creatinine Clearance: 33.3 mL/min (A) (by C-G formula based on SCr of 1.54 mg/dL (H)). Liver Function Tests: Recent Labs  Lab 11/15/20 0101 11/16/20 0325  AST 37 23  ALT 29 23  ALKPHOS 54 42  BILITOT 1.1 0.6  PROT 6.7 5.9*  ALBUMIN 2.8* 2.5*   No results for input(s): LIPASE, AMYLASE in the last 168 hours. No results for input(s): AMMONIA in the last 168 hours. Coagulation Profile: Recent Labs  Lab 11/15/20 0101  INR 1.1   Cardiac Enzymes: No results for input(s): CKTOTAL, CKMB, CKMBINDEX, TROPONINI in the last 168 hours. BNP (last 3 results) No results for input(s): PROBNP in the last 8760 hours. HbA1C: No results for input(s): HGBA1C in the last 72 hours. CBG: Recent Labs  Lab 11/15/20 1158 11/15/20 1645 11/15/20 2056 11/16/20 0743 11/16/20 1156  GLUCAP 307* 294* 267* 178* 162*   Lipid Profile: Recent Labs    11/13/20 2250 11/14/20 0327  CHOL 197 184  HDL 37* 33*  LDLCALC 122* 115*  TRIG 190* 181*  CHOLHDL 5.3 5.6   Thyroid Function Tests: Recent Labs    11/15/20 0101  TSH 1.138   Anemia Panel: Recent Labs    11/15/20 0101  VITAMINB12 184  FOLATE 7.1   Sepsis Labs: Recent Labs  Lab 11/14/20 2124 11/15/20 0101 11/16/20 0857 11/16/20 1139  PROCALCITON  --  <0.10  --   --   LATICACIDVEN 2.8* 2.4* 1.4 1.7    Recent Results (from the past 240 hour(s))  Resp Panel by RT-PCR (Flu A&B, Covid) Nasopharyngeal Swab     Status: None   Collection Time: 11/14/20 10:21 PM   Specimen:  Nasopharyngeal Swab; Nasopharyngeal(NP) swabs in vial transport medium  Result Value Ref Range Status   SARS Coronavirus 2 by RT PCR NEGATIVE NEGATIVE Final    Comment: (NOTE) SARS-CoV-2 target nucleic acids are NOT DETECTED.  The SARS-CoV-2 RNA is generally detectable in upper respiratory specimens during the acute phase of  infection. The lowest concentration of SARS-CoV-2 viral copies this assay can detect is 138 copies/mL. A negative result does not preclude SARS-Cov-2 infection and should not be used as the sole basis for treatment or other patient management decisions. A negative result may occur with  improper specimen collection/handling, submission of specimen other than nasopharyngeal swab, presence of viral mutation(s) within the areas targeted by this assay, and inadequate number of viral copies(<138 copies/mL). A negative result must be combined with clinical observations, patient history, and epidemiological information. The expected result is Negative.  Fact Sheet for Patients:  EntrepreneurPulse.com.au  Fact Sheet for Healthcare Providers:  IncredibleEmployment.be  This test is no t yet approved or cleared by the [place] FDA and  has been authorized for detection and/or diagnosis of SARS-CoV-2 by FDA under an Emergency Use Authorization (EUA). This EUA will remain  in effect (meaning this test can be used) for the duration of the COVID-19 declaration under Section 564(b)(1) of the Act, 21 U.S.C.section 360bbb-3(b)(1), unless the authorization is terminated  or revoked sooner.       Influenza A by PCR NEGATIVE NEGATIVE Final   Influenza B by PCR NEGATIVE NEGATIVE Final    Comment: (NOTE) The Xpert Xpress SARS-CoV-2/FLU/RSV plus assay is intended as an aid in the diagnosis of influenza from Nasopharyngeal swab specimens and should not be used as a sole basis for treatment. Nasal washings and aspirates are unacceptable for  Xpert Xpress SARS-CoV-2/FLU/RSV testing.  Fact Sheet for Patients: EntrepreneurPulse.com.au  Fact Sheet for Healthcare Providers: IncredibleEmployment.be  This test is not yet approved or cleared by the [place] FDA and has been authorized for detection and/or diagnosis of SARS-CoV-2 by FDA under an Emergency Use Authorization (EUA). This EUA will remain in effect (meaning this test can be used) for the duration of the COVID-19 declaration under Section 564(b)(1) of the Act, 21 U.S.C. section 360bbb-3(b)(1), unless the authorization is terminated or revoked.  Performed at [facility], [place] [address], [place], [place] [address]   Blood culture (routine x 2)     Status: None (Preliminary result)   Collection Time: 11/15/20 12:41 AM   Specimen: BLOOD  Result Value Ref Range Status   Specimen Description BLOOD LEFT ARM  Final   Special Requests   Final    BOTTLES DRAWN AEROBIC AND ANAEROBIC Blood Culture results may not be optimal due to an inadequate volume of blood received in culture bottles   Culture   Final    NO GROWTH 1 DAY Performed at [facility], [place] [address] [place], [place] [address]    Report Status PENDING  Incomplete  Blood culture (routine x 2)     Status: None (Preliminary result)   Collection Time: 11/15/20  1:01 AM   Specimen: BLOOD  Result Value Ref Range Status   Specimen Description BLOOD RIGHT HAND  Final   Special Requests   Final    BOTTLES DRAWN AEROBIC ONLY Blood Culture results may not be optimal due to an inadequate volume of blood received in culture bottles   Culture   Final    NO GROWTH 1 DAY Performed at [facility], [place] [address], [place], [place] [address]    Report Status PENDING  Incomplete      Radiology Studies: CT ABDOMEN PELVIS WO CONTRAST  Result Date: 11/15/2020 CLINICAL DATA:  80 year old female with history of renal failure and sepsis. Pyelonephritis. EXAM: CT  ABDOMEN AND PELVIS WITHOUT CONTRAST TECHNIQUE: Multidetector CT  imaging of the abdomen and pelvis was performed following the standard protocol without IV contrast. COMPARISON:  CT the abdomen and pelvis 07/25/2020. FINDINGS: Lower chest: Small right pleural effusion lying dependently. Atherosclerotic calcifications in the descending thoracic aorta as well as the left anterior descending, left circumflex and right coronary arteries. Calcifications of the aortic valve. Hepatobiliary: Heterogeneous areas of low attenuation are noted throughout the hepatic parenchyma, indicative of heterogeneous hepatic steatosis. Large 3.7 x 3.1 cm low-attenuation lesion in the left lobe of the liver, previously characterized as a simple cyst. No other definite suspicious hepatic lesions are noted on today's noncontrast CT examination. Amorphous high attenuation material lying dependently in the gallbladder which may reflect biliary sludge and/or innumerable tiny calcified gallstones. Gallbladder is not distended. No gallbladder wall thickening. Trace amount of pericholecystic fluid, without overt surrounding inflammatory changes. Pancreas: No definite pancreatic mass or peripancreatic fluid collections or inflammatory changes noted on today's noncontrast CT examination. Spleen: Unremarkable. Adrenals/Urinary Tract: Unenhanced appearance of the kidneys and bilateral adrenal glands is unremarkable. No hydroureteronephrosis. Urinary bladder is normal in appearance. Stomach/Bowel: Unenhanced appearance of the stomach is normal. No pathologic dilatation of small bowel or colon. Numerous colonic diverticulae are noted, without surrounding inflammatory changes to suggest an acute diverticulitis at this time. The appendix is not confidently identified and may be surgically absent. Regardless, there are no inflammatory changes noted adjacent to the cecum to suggest the presence of an acute appendicitis at this time. Vascular/Lymphatic: Aortic  atherosclerosis. No lymphadenopathy noted in the abdomen or pelvis. Reproductive: Uterus and ovaries are unremarkable in appearance. Other: Trace volume of ascites.  No pneumoperitoneum. Musculoskeletal: No aggressive appearing lytic or blastic lesions are noted in the visualized portions of the skeleton. IMPRESSION: 1. Biliary sludge and/or tiny calcified gallstones lying dependently in the gallbladder. Gallbladder is not distended and there is no gallbladder wall thickening. There is a trace amount of pericholecystic fluid, however, there are no overt surrounding inflammatory changes. If there is clinical concern for acute cholecystitis, further evaluation with right upper quadrant ultrasound should be considered. 2. Trace volume of ascites. 3. No other acute findings are noted in the abdomen or pelvis. 4. Colonic diverticulosis without evidence of acute diverticulitis at this time. 5. Small right pleural effusion lying dependently. 6. Aortic atherosclerosis, in addition to 3 vessel coronary artery disease. Assessment for potential risk factor modification, dietary therapy or pharmacologic therapy may be warranted, if clinically indicated. 7. There are calcifications of the aortic valve. Echocardiographic correlation for evaluation of potential valvular dysfunction may be warranted if clinically indicated. 8. Additional incidental findings, as above. Electronically Signed   By: Vinnie Langton M.D.   On: 11/15/2020 14:19   DG Chest Portable 1 View  Result Date: 11/14/2020 CLINICAL DATA:  Chest pain. EXAM: PORTABLE CHEST 1 VIEW COMPARISON:  September 27, 2018. FINDINGS: Stable cardiomediastinal silhouette. No pneumothorax or pleural effusion is noted. Both lungs are clear. The visualized skeletal structures are unremarkable. IMPRESSION: No active disease. Electronically Signed   By: Marijo Conception M.D.   On: 11/14/2020 21:55   DG Abd Portable 2V  Result Date: 11/15/2020 CLINICAL DATA:  Bowel obstruction,  vomiting EXAM: PORTABLE ABDOMEN - 2 VIEW COMPARISON:  None. FINDINGS: Lungs are clear. Lung apices are partially obscured, however, no definite pneumothorax. No pleural effusion. Cardiac size within normal limits. Pulmonary vascularity is normal. Surgical clips are seen within the left axilla. Normal abdominal gas pattern. No free intraperitoneal gas. Moderate stool within the distal colon. Inferior pelvis  is partially excluded. No organomegaly. Vascular calcifications noted within the pelvis. IMPRESSION: Normal abdominal gas pattern.  Moderate stool. Electronically Signed   By: Fidela Salisbury MD   On: 11/15/2020 02:43   ECHOCARDIOGRAM COMPLETE  Result Date: 11/16/2020    ECHOCARDIOGRAM REPORT   Patient Name:   Stefanie Houston Date of Exam: 11/16/2020 Medical Rec #:  [id number]     Height:       62.0 in Accession #:    [id number]    Weight:       220.0 lb Date of Birth:  [birth year]     BSA:          1.991 m Patient Age:    25 years      BP:           136/68 mmHg Patient Gender: F             HR:           103 bpm. Exam Location:  Inpatient Procedure: 2D Echo, Cardiac Doppler and Color Doppler Indications:    Chest Pain R07.9  History:        Patient has no prior history of Echocardiogram examinations.                 CAD; Risk Factors:Hypertension, Diabetes, Dyslipidemia and                 Non-Smoker.  Sonographer:    Vickie Epley RDCS Referring Phys: [id number] [facility]  Sonographer Comments: Image acquisition challenging due to patient body habitus. Refused definity. IMPRESSIONS  1. Left ventricular ejection fraction, by estimation, is 55 to 60%. The left ventricle has normal function. The left ventricle has no regional wall motion abnormalities. Left ventricular diastolic parameters are consistent with Grade I diastolic dysfunction (impaired relaxation).  2. Right ventricular systolic function is moderately reduced. The right ventricular size is moderately enlarged. There is moderately elevated pulmonary  artery systolic pressure. The estimated right ventricular systolic pressure is 63.8 mmHg.  3. Right atrial size was mildly dilated.  4. The mitral valve is normal in structure. Mild mitral valve regurgitation. No evidence of mitral stenosis.  5. Tricuspid valve regurgitation is moderate to severe.  6. The aortic valve is normal in structure. There is moderate calcification of the aortic valve. There is moderate thickening of the aortic valve. Aortic valve regurgitation is trivial. Mild to moderate aortic valve sclerosis/calcification is present, without any evidence of aortic stenosis.  7. The inferior vena cava is dilated in size with <50% respiratory variability, suggesting right atrial pressure of 15 mmHg. FINDINGS  Left Ventricle: Left ventricular ejection fraction, by estimation, is 55 to 60%. The left ventricle has normal function. The left ventricle has no regional wall motion abnormalities. The left ventricular internal cavity size was normal in size. There is  no left ventricular hypertrophy. Left ventricular diastolic parameters are consistent with Grade I diastolic dysfunction (impaired relaxation). Normal left ventricular filling pressure. Right Ventricle: The right ventricular size is moderately enlarged. No increase in right ventricular wall thickness. Right ventricular systolic function is moderately reduced. There is moderately elevated pulmonary artery systolic pressure. The tricuspid  regurgitant velocity is 3.29 m/s, and with an assumed right atrial pressure of 15 mmHg, the estimated right ventricular systolic pressure is 75.6 mmHg. Left Atrium: Left atrial size was normal in size. Right Atrium: Right atrial size was mildly dilated. Pericardium: There is no evidence of pericardial effusion. Mitral Valve: The mitral valve is normal in structure.  Mild mitral valve regurgitation. No evidence of mitral valve stenosis. Tricuspid Valve: The tricuspid valve is normal in structure. Tricuspid valve  regurgitation is moderate to severe. No evidence of tricuspid stenosis. Aortic Valve: The aortic valve is normal in structure. There is moderate calcification of the aortic valve. There is moderate thickening of the aortic valve. Aortic valve regurgitation is trivial. Mild to moderate aortic valve sclerosis/calcification is present, without any evidence of aortic stenosis. Pulmonic Valve: The pulmonic valve was normal in structure. Pulmonic valve regurgitation is not visualized. No evidence of pulmonic stenosis. Aorta: The aortic root is normal in size and structure. Venous: The inferior vena cava is dilated in size with less than 50% respiratory variability, suggesting right atrial pressure of 15 mmHg. IAS/Shunts: No atrial level shunt detected by color flow Doppler.  LEFT VENTRICLE PLAX 2D LVIDd:         4.50 cm     Diastology LVIDs:         3.00 cm     LV e' medial:    5.44 cm/s LV PW:         0.90 cm     LV E/e' medial:  11.9 LV IVS:        0.90 cm     LV e' lateral:   7.07 cm/s LVOT diam:     2.20 cm     LV E/e' lateral: 9.1 LV SV:         38 LV SV Index:   19 LVOT Area:     3.80 cm  LV Volumes (MOD) LV vol d, MOD A4C: 90.0 ml LV vol s, MOD A4C: 47.3 ml LV SV MOD A4C:     90.0 ml RIGHT VENTRICLE RV S prime:     9.57 cm/s TAPSE (M-mode): 1.6 cm LEFT ATRIUM             Index       RIGHT ATRIUM           Index LA diam:        3.00 cm 1.51 cm/m  RA Area:     13.10 cm LA Vol (A2C):   21.4 ml 10.75 ml/m RA Volume:   31.80 ml  15.97 ml/m LA Vol (A4C):   21.8 ml 10.95 ml/m LA Biplane Vol: 22.6 ml 11.35 ml/m  AORTIC VALVE LVOT Vmax:   67.20 cm/s LVOT Vmean:  50.900 cm/s LVOT VTI:    0.100 m  AORTA Ao Root diam: 3.00 cm MITRAL VALVE               TRICUSPID VALVE MV Area (PHT): 8.25 cm    TR Peak grad:   43.3 mmHg MV Decel Time: 92 msec     TR Vmax:        329.00 cm/s MV E velocity: 64.50 cm/s MV A velocity: 89.70 cm/s  SHUNTS MV E/A ratio:  0.72        Systemic VTI:  0.10 m                            Systemic  Diam: 2.20 cm Ena Dawley MD Electronically signed by Ena Dawley MD Signature Date/Time: 11/16/2020/11:58:16 AM    Final     Scheduled Meds: . aspirin EC  81 mg Oral Daily  . atorvastatin  20 mg Oral Daily  . enoxaparin (LOVENOX) injection  30 mg Subcutaneous Daily  . insulin aspart  0-15 Units Subcutaneous TID WC  .  insulin aspart  4 Units Subcutaneous TID WC  . insulin glargine  20 Units Subcutaneous QHS  . pantoprazole  40 mg Oral Daily  . psyllium  1 packet Oral Daily   Continuous Infusions: . cefTRIAXone (ROCEPHIN)  IV 1 g (11/15/20 2307)     LOS: 2 days   Time spent: 30 minutes   Darliss Cheney, MD [facility]  11/16/2020, 2:37 PM   To contact the attending provider between 7A-7P or the covering provider during after hours 7P-7A, please log into the web site [email or web address].

## 2020-11-17 DIAGNOSIS — N390 Urinary tract infection, site not specified: Secondary | ICD-10-CM | POA: Diagnosis not present

## 2020-11-17 DIAGNOSIS — A4150 Gram-negative sepsis, unspecified: Secondary | ICD-10-CM | POA: Diagnosis not present

## 2020-11-17 LAB — BASIC METABOLIC PANEL
Anion gap: 8 (ref 5–15)
BUN: 18 mg/dL (ref 8–23)
CO2: 24 mmol/L (ref 22–32)
Calcium: 8.3 mg/dL — ABNORMAL LOW (ref 8.9–10.3)
Chloride: 105 mmol/L (ref 98–111)
Creatinine, Ser: 1.1 mg/dL — ABNORMAL HIGH (ref 0.44–1.00)
GFR, Estimated: 51 mL/min — ABNORMAL LOW (ref >60)
Glucose, Bld: 134 mg/dL — ABNORMAL HIGH (ref 70–99)
Potassium: 4 mmol/L (ref 3.5–5.1)
Sodium: 137 mmol/L (ref 135–145)

## 2020-11-17 LAB — CBC WITH DIFFERENTIAL/PLATELET
Abs Immature Granulocytes: 0.04 10*3/uL (ref 0.00–0.07)
Basophils Absolute: 0.1 10*3/uL (ref 0.0–0.1)
Basophils Relative: 1 %
Eosinophils Absolute: 0.2 10*3/uL (ref 0.0–0.5)
Eosinophils Relative: 2 %
HCT: 37.5 % (ref 36.0–46.0)
Hemoglobin: 12.1 g/dL (ref 12.0–15.0)
Immature Granulocytes: 0 %
Lymphocytes Relative: 24 %
Lymphs Abs: 2.3 10*3/uL (ref 0.7–4.0)
MCH: 30 pg (ref 26.0–34.0)
MCHC: 32.3 g/dL (ref 30.0–36.0)
MCV: 93.1 fL (ref 80.0–100.0)
Monocytes Absolute: 1.1 10*3/uL — ABNORMAL HIGH (ref 0.1–1.0)
Monocytes Relative: 12 %
Neutro Abs: 5.8 10*3/uL (ref 1.7–7.7)
Neutrophils Relative %: 61 %
Platelets: 175 10*3/uL (ref 150–400)
RBC: 4.03 MIL/uL (ref 3.87–5.11)
RDW: 12.5 % (ref 11.5–15.5)
WBC: 9.4 10*3/uL (ref 4.0–10.5)
nRBC: 0 % (ref 0.0–0.2)

## 2020-11-17 LAB — GLUCOSE, CAPILLARY
Glucose-Capillary: 139 mg/dL — ABNORMAL HIGH (ref 70–99)
Glucose-Capillary: 147 mg/dL — ABNORMAL HIGH (ref 70–99)
Glucose-Capillary: 212 mg/dL — ABNORMAL HIGH (ref 70–99)
Glucose-Capillary: 212 mg/dL — ABNORMAL HIGH (ref 70–99)

## 2020-11-17 MED ORDER — ENOXAPARIN SODIUM 40 MG/0.4ML ~~LOC~~ SOLN
40.0000 mg | Freq: Every day | SUBCUTANEOUS | Status: DC
  Administered 2020-11-18: 40 mg via SUBCUTANEOUS
  Filled 2020-11-17: qty 0.4

## 2020-11-17 NOTE — Progress Notes (Addendum)
Progress Note  Patient Name: Stefanie Houston Date of Encounter: 11/17/2020  [facility] Cardiologist: Werner Lean, MD   Subjective   No cardiac complaints.  Breathing improved.  No chest pain.  Inpatient Medications    Scheduled Meds: . aspirin EC  81 mg Oral Daily  . atorvastatin  20 mg Oral Daily  . enoxaparin (LOVENOX) injection  30 mg Subcutaneous Daily  . insulin aspart  0-15 Units Subcutaneous TID WC  . insulin aspart  4 Units Subcutaneous TID WC  . insulin glargine  20 Units Subcutaneous QHS  . pantoprazole  40 mg Oral Daily  . psyllium  1 packet Oral Daily   Continuous Infusions: . cefTRIAXone (ROCEPHIN)  IV 1 g (11/16/20 2150)   PRN Meds: acetaminophen **OR** acetaminophen, hydrOXYzine, ondansetron **OR** ondansetron (ZOFRAN) IV, polyethylene glycol, prochlorperazine   Vital Signs    Vitals:   11/16/20 0745 11/16/20 1204 11/16/20 2315 11/17/20 0747  BP: 136/68 (!) 160/75 (!) 144/72 (!) 156/78  Pulse: 88 100 99 93  Resp: (!) 21 17 18 17   Temp: 98.6 F (37 C) 98.6 F (37 C) 98.5 F (36.9 C) 98.3 F (36.8 C)  TempSrc: Oral Oral Oral Oral  SpO2: 100% 99%  99%  Weight:      Height:        Intake/Output Summary (Last 24 hours) at 11/17/2020 1014 Last data filed at 11/17/2020 0850 Gross per 24 hour  Intake 240 ml  Output 600 ml  Net -360 ml   Last 3 Weights 11/12/2020 07/25/2020  Weight (lbs) 220 lb 0.3 oz 220 lb  Weight (kg) 99.8 kg 99.791 kg      Telemetry    No recurrence of AI VR..- Personally Reviewed  ECG    Has not been performed since yesterday's interpretation of the tracing of that date.- Personally Reviewed  Physical Exam  Obese, elderly and appears to be doing well GEN: No acute distress.   Neck: No JVD Cardiac: RRR, with 2/6 right upper sternal and left mid sternal systolic murmur, but no rubs, however there is an S4 gallop.  Respiratory: Clear to auscultation bilaterally. GI: Soft, nontender, non-distended  MS:  Bilateral trace ankle edema Neuro:  Nonfocal  Psych: Normal affect   Labs    High Sensitivity Troponin:   Recent Labs  Lab 11/15/20 0101 11/15/20 1002 11/15/20 1158  TROPONINIHS 301* 415* 361*      Chemistry Recent Labs  Lab 11/15/20 0101 11/16/20 0325 11/17/20 0305  NA 132* 139 137  K 3.9 4.3 4.0  CL 101 107 105  CO2 17* 22 24  GLUCOSE 395* 208* 134*  BUN 29* 28* 18  CREATININE 2.21* 1.54* 1.10*  CALCIUM 7.7* 8.2* 8.3*  PROT 6.7 5.9*  --   ALBUMIN 2.8* 2.5*  --   AST 37 23  --   ALT 29 23  --   ALKPHOS 54 42  --   BILITOT 1.1 0.6  --   GFRNONAA 22* 34* 51*  ANIONGAP 14 10 8      Hematology Recent Labs  Lab 11/15/20 0101 11/16/20 0325 11/17/20 0305  WBC 14.9* 10.9* 9.4  RBC 4.31 3.87 4.03  HGB 13.2 11.7* 12.1  HCT 40.1 36.3 37.5  MCV 93.0 93.8 93.1  MCH 30.6 30.2 30.0  MCHC 32.9 32.2 32.3  RDW 12.9 13.0 12.5  PLT 198 157 175    BNP Recent Labs  Lab 11/15/20 0101  BNP 73.3     DDimer No results for input(s):  DDIMER in the last 168 hours.   Radiology    CT ABDOMEN PELVIS WO CONTRAST  Result Date: 11/15/2020 CLINICAL DATA:  80 year old female with history of renal failure and sepsis. Pyelonephritis. EXAM: CT ABDOMEN AND PELVIS WITHOUT CONTRAST TECHNIQUE: Multidetector CT imaging of the abdomen and pelvis was performed following the standard protocol without IV contrast. COMPARISON:  CT the abdomen and pelvis 07/25/2020. FINDINGS: Lower chest: Small right pleural effusion lying dependently. Atherosclerotic calcifications in the descending thoracic aorta as well as the left anterior descending, left circumflex and right coronary arteries. Calcifications of the aortic valve. Hepatobiliary: Heterogeneous areas of low attenuation are noted throughout the hepatic parenchyma, indicative of heterogeneous hepatic steatosis. Large 3.7 x 3.1 cm low-attenuation lesion in the left lobe of the liver, previously characterized as a simple cyst. No other definite  suspicious hepatic lesions are noted on today's noncontrast CT examination. Amorphous high attenuation material lying dependently in the gallbladder which may reflect biliary sludge and/or innumerable tiny calcified gallstones. Gallbladder is not distended. No gallbladder wall thickening. Trace amount of pericholecystic fluid, without overt surrounding inflammatory changes. Pancreas: No definite pancreatic mass or peripancreatic fluid collections or inflammatory changes noted on today's noncontrast CT examination. Spleen: Unremarkable. Adrenals/Urinary Tract: Unenhanced appearance of the kidneys and bilateral adrenal glands is unremarkable. No hydroureteronephrosis. Urinary bladder is normal in appearance. Stomach/Bowel: Unenhanced appearance of the stomach is normal. No pathologic dilatation of small bowel or colon. Numerous colonic diverticulae are noted, without surrounding inflammatory changes to suggest an acute diverticulitis at this time. The appendix is not confidently identified and may be surgically absent. Regardless, there are no inflammatory changes noted adjacent to the cecum to suggest the presence of an acute appendicitis at this time. Vascular/Lymphatic: Aortic atherosclerosis. No lymphadenopathy noted in the abdomen or pelvis. Reproductive: Uterus and ovaries are unremarkable in appearance. Other: Trace volume of ascites.  No pneumoperitoneum. Musculoskeletal: No aggressive appearing lytic or blastic lesions are noted in the visualized portions of the skeleton. IMPRESSION: 1. Biliary sludge and/or tiny calcified gallstones lying dependently in the gallbladder. Gallbladder is not distended and there is no gallbladder wall thickening. There is a trace amount of pericholecystic fluid, however, there are no overt surrounding inflammatory changes. If there is clinical concern for acute cholecystitis, further evaluation with right upper quadrant ultrasound should be considered. 2. Trace volume of  ascites. 3. No other acute findings are noted in the abdomen or pelvis. 4. Colonic diverticulosis without evidence of acute diverticulitis at this time. 5. Small right pleural effusion lying dependently. 6. Aortic atherosclerosis, in addition to 3 vessel coronary artery disease. Assessment for potential risk factor modification, dietary therapy or pharmacologic therapy may be warranted, if clinically indicated. 7. There are calcifications of the aortic valve. Echocardiographic correlation for evaluation of potential valvular dysfunction may be warranted if clinically indicated. 8. Additional incidental findings, as above. Electronically Signed   By: Vinnie Langton M.D.   On: 11/15/2020 14:19   ECHOCARDIOGRAM COMPLETE  Result Date: 11/16/2020    ECHOCARDIOGRAM REPORT   Patient Name:   Stefanie Houston Date of Exam: 11/16/2020 Medical Rec #:  [id number]     Height:       62.0 in Accession #:    [id number]    Weight:       220.0 lb Date of Birth:  10/14/1940     BSA:          1.991 m Patient Age:    6 years      BP:  136/68 mmHg Patient Gender: F             HR:           103 bpm. Exam Location:  Inpatient Procedure: 2D Echo, Cardiac Doppler and Color Doppler Indications:    Chest Pain R07.9  History:        Patient has no prior history of Echocardiogram examinations.                 CAD; Risk Factors:Hypertension, Diabetes, Dyslipidemia and                 Non-Smoker.  Sonographer:    Vickie Epley RDCS Referring Phys: [id number] [facility]  Sonographer Comments: Image acquisition challenging due to patient body habitus. Refused definity. IMPRESSIONS  1. Left ventricular ejection fraction, by estimation, is 55 to 60%. The left ventricle has normal function. The left ventricle has no regional wall motion abnormalities. Left ventricular diastolic parameters are consistent with Grade I diastolic dysfunction (impaired relaxation).  2. Right ventricular systolic function is moderately reduced. The right  ventricular size is moderately enlarged. There is moderately elevated pulmonary artery systolic pressure. The estimated right ventricular systolic pressure is 13.0 mmHg.  3. Right atrial size was mildly dilated.  4. The mitral valve is normal in structure. Mild mitral valve regurgitation. No evidence of mitral stenosis.  5. Tricuspid valve regurgitation is moderate to severe.  6. The aortic valve is normal in structure. There is moderate calcification of the aortic valve. There is moderate thickening of the aortic valve. Aortic valve regurgitation is trivial. Mild to moderate aortic valve sclerosis/calcification is present, without any evidence of aortic stenosis.  7. The inferior vena cava is dilated in size with <50% respiratory variability, suggesting right atrial pressure of 15 mmHg. FINDINGS  Left Ventricle: Left ventricular ejection fraction, by estimation, is 55 to 60%. The left ventricle has normal function. The left ventricle has no regional wall motion abnormalities. The left ventricular internal cavity size was normal in size. There is  no left ventricular hypertrophy. Left ventricular diastolic parameters are consistent with Grade I diastolic dysfunction (impaired relaxation). Normal left ventricular filling pressure. Right Ventricle: The right ventricular size is moderately enlarged. No increase in right ventricular wall thickness. Right ventricular systolic function is moderately reduced. There is moderately elevated pulmonary artery systolic pressure. The tricuspid  regurgitant velocity is 3.29 m/s, and with an assumed right atrial pressure of 15 mmHg, the estimated right ventricular systolic pressure is 86.5 mmHg. Left Atrium: Left atrial size was normal in size. Right Atrium: Right atrial size was mildly dilated. Pericardium: There is no evidence of pericardial effusion. Mitral Valve: The mitral valve is normal in structure. Mild mitral valve regurgitation. No evidence of mitral valve stenosis.  Tricuspid Valve: The tricuspid valve is normal in structure. Tricuspid valve regurgitation is moderate to severe. No evidence of tricuspid stenosis. Aortic Valve: The aortic valve is normal in structure. There is moderate calcification of the aortic valve. There is moderate thickening of the aortic valve. Aortic valve regurgitation is trivial. Mild to moderate aortic valve sclerosis/calcification is present, without any evidence of aortic stenosis. Pulmonic Valve: The pulmonic valve was normal in structure. Pulmonic valve regurgitation is not visualized. No evidence of pulmonic stenosis. Aorta: The aortic root is normal in size and structure. Venous: The inferior vena cava is dilated in size with less than 50% respiratory variability, suggesting right atrial pressure of 15 mmHg. IAS/Shunts: No atrial level shunt detected by color flow Doppler.  LEFT VENTRICLE PLAX 2D LVIDd:         4.50 cm     Diastology LVIDs:         3.00 cm     LV e' medial:    5.44 cm/s LV PW:         0.90 cm     LV E/e' medial:  11.9 LV IVS:        0.90 cm     LV e' lateral:   7.07 cm/s LVOT diam:     2.20 cm     LV E/e' lateral: 9.1 LV SV:         38 LV SV Index:   19 LVOT Area:     3.80 cm  LV Volumes (MOD) LV vol d, MOD A4C: 90.0 ml LV vol s, MOD A4C: 47.3 ml LV SV MOD A4C:     90.0 ml RIGHT VENTRICLE RV S prime:     9.57 cm/s TAPSE (M-mode): 1.6 cm LEFT ATRIUM             Index       RIGHT ATRIUM           Index LA diam:        3.00 cm 1.51 cm/m  RA Area:     13.10 cm LA Vol (A2C):   21.4 ml 10.75 ml/m RA Volume:   31.80 ml  15.97 ml/m LA Vol (A4C):   21.8 ml 10.95 ml/m LA Biplane Vol: 22.6 ml 11.35 ml/m  AORTIC VALVE LVOT Vmax:   67.20 cm/s LVOT Vmean:  50.900 cm/s LVOT VTI:    0.100 m  AORTA Ao Root diam: 3.00 cm MITRAL VALVE               TRICUSPID VALVE MV Area (PHT): 8.25 cm    TR Peak grad:   43.3 mmHg MV Decel Time: 92 msec     TR Vmax:        329.00 cm/s MV E velocity: 64.50 cm/s MV A velocity: 89.70 cm/s  SHUNTS MV E/A  ratio:  0.72        Systemic VTI:  0.10 m                            Systemic Diam: 2.20 cm Ena Dawley MD Electronically signed by Ena Dawley MD Signature Date/Time: 11/16/2020/11:58:16 AM    Final     Cardiac Studies    2D Doppler echocardiogram without aortic stenosis   Preserved EF 55 to 60%.  See above full report  Patient Profile     80 y.o. female Morbid Obesity, baseline left bundle branch block, sepsis due to gram-negative UTI, diabetes mellitus type 2, acute on chronic kidney disease III hallucinations and Delusions, Hypotension, lactic acidosis, and intermittent wide-complex tachycardia (AIVR versus ectopic atrial tachycardia with aberrancy).  Previously followed by Dr. Darral Dash -with history of stenting prior to 2017 but no mention of vessel or type stent.  Assessment & Plan    1. Wide-complex tachycardia, resolved and presumed secondary to intercurrent stress of sepsis.  No specific work-up at this time.   2. 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  When she is better from this underlying condition, consider outpatient ischemic evaluation.   3. Non-ST elevation myocardial infarction: Type II 4. Gram-negative sepsis secondary to UTI: Toxicity has improved 5. Hypotension and lactic acidosis: Resolved 6. 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  She does have diastolic dysfunction but is not volume overloaded.  Echo does not reveal significant mitral regurgitation or aortic stenosis.  The cardiac issues seem to be provoked by the underlying episode of sepsis.  She had follow-up with Dr. Harrie Foreman as OP.       For questions or updates, please contact [facility] Please consult [email or web address] for contact info under    [place] will sign off.    Medication Recommendations: Continue statin.  Start low-dose beta-blocker therapy, metoprolol succinate 12.5 mg/day. Other recommendations (labs, testing, etc): Possible ischemic evaluation as outpatient Follow up as an outpatient: Cardiologist within the [facility] system or with Dr. Gasper Sells at [facility] if he is not established at [place], Sinclair Grooms, MD  11/17/2020, 10:14 AM

## 2020-11-17 NOTE — TOC Progression Note (Signed)
Transition of Care [facility]) - Progression Note    Patient Details  Name: Elissia Spiewak MRN: [id number] Date of Birth: [birth year]  Transition of Care [facility] CM/SW Contact  Angelita Ingles, RN Phone Number: [phone]  11/17/2020, 8:46 AM  Clinical Narrative:    CM received message from MD stating that patient is medically ready for [facility]. Message has been sent to disposition SW Paulla Dolly ) to determine is beds are available. Per SW the patient will be referred out to other to other facilities. CM will be notified if facilities have bed availability.        Expected Discharge Plan and Services                                                 Social Determinants of Health (SDOH) Interventions    Readmission Risk Interventions No flowsheet data found.
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
## 2020-11-17 NOTE — Evaluation (Signed)
Physical Therapy Evaluation Patient Details Name: Stefanie Houston MRN: [id number] DOB: [birth year] Today's Date: 11/17/2020   History of Present Illness  80 yo female with onset of paranoid delusions and hypotension with tachycardia was brought to hosp, now referred to PT. Noted pleural effusion, ascites, cholelithiasis, and diverticulosis.  PMHx:  atherosclerosis, DM, CAD, meningioma,  Clinical Impression  Pt was seen for gait to assess need for O2:SATURATION QUALIFICATIONS: (This note is used to comply with regulatory documentation for home oxygen)  Patient Saturations on Room Air at Rest = 100%  Patient Saturations on Room Air while Ambulating =92%  Patient Saturations on N A Liters of oxygen while Ambulating = NA%  Please briefly explain why patient needs home oxygen:  No need was yet identified Follow along with pt and will need to verify her husband can provide contact support of gait for her to get home.  Follow for acute goals of PT.    Follow Up Recommendations Home health PT;Supervision/Assistance - 24 hour;Supervision for mobility/OOB    Equipment Recommendations  None recommended by PT    Recommendations for Other Services       Precautions / Restrictions Precautions Precautions: Fall Precaution Comments: confused and unable to self determine safety Restrictions Weight Bearing Restrictions: No      Mobility  Bed Mobility Overal bed mobility: Needs Assistance Bed Mobility: Supine to Sit     Supine to sit: Min assist     General bed mobility comments: min assist to power up and steady    Transfers Overall transfer level: Needs assistance Equipment used: Rolling walker (2 wheeled);1 person hand held assist Transfers: Sit to/from Stand Sit to Stand: Min assist         General transfer comment: needs dense cues for hand placement  Ambulation/Gait Ambulation/Gait assistance: Min guard;Min assist Gait Distance (Feet): 35 Feet Assistive device: Rolling  walker (2 wheeled);1 person hand held assist Gait Pattern/deviations: Step-to pattern;Step-through pattern;Decreased stride length;Wide base of support;Trunk flexed Gait velocity: reduced, unsteady   General Gait Details: pt is wobbly and tends to give out a little in her knees.  Stairs            Wheelchair Mobility    Modified Rankin (Stroke Patients Only)       Balance Overall balance assessment: Needs assistance Sitting-balance support: Bilateral upper extremity supported Sitting balance-Leahy Scale: Good     Standing balance support: Bilateral upper extremity supported;During functional activity Standing balance-Leahy Scale: Poor                               Pertinent Vitals/Pain Pain Assessment: No/denies pain    Home Living Family/patient expects to be discharged to:: Unsure                 Additional Comments: pt is able to be assisted to walk but not clear from pt how capable her husband will be    Prior Function Level of Independence: Independent with assistive device(s)         Comments: pt reports SPC vs rollator with ind     Hand Dominance   Dominant Hand: Right    Extremity/Trunk Assessment   Upper Extremity Assessment Upper Extremity Assessment: Overall WFL for tasks assessed    Lower Extremity Assessment Lower Extremity Assessment: Generalized weakness    Cervical / Trunk Assessment Cervical / Trunk Assessment: Kyphotic  Communication   Communication: No difficulties  Cognition Arousal/Alertness: Awake/alert Behavior During  Therapy: Impulsive Overall Cognitive Status: No family/caregiver present to determine baseline cognitive functioning                                 General Comments: unclear if this is baseline      General Comments General comments (skin integrity, edema, etc.): pt is up to side of bed with help and with reminders for safety was noted to be 100% sat on O2.  Removed to see  how pt tolerated, and dropped to 92% on room air to walk.  Replaced cannula so pt would be comfortable due to c/o of SOB    Exercises     Assessment/Plan    PT Assessment Patient needs continued PT services  PT Problem List Decreased strength;Decreased range of motion;Decreased activity tolerance;Decreased balance;Decreased mobility;Decreased coordination;Decreased cognition;Decreased knowledge of use of DME;Decreased safety awareness;Cardiopulmonary status limiting activity       PT Treatment Interventions DME instruction;Gait training;Functional mobility training;Therapeutic activities;Therapeutic exercise;Balance training;Neuromuscular re-education;Patient/family education    PT Goals (Current goals can be found in the Care Plan section)  Acute Rehab PT Goals Patient Stated Goal: to walk and go home PT Goal Formulation: With patient Time For Goal Achievement: 12/01/20 Potential to Achieve Goals: Fair    Frequency Min 3X/week   Barriers to discharge Decreased caregiver support (unclear how much husband can help)      Co-evaluation               AM-PAC PT "6 Clicks" Mobility  Outcome Measure Help needed turning from your back to your side while in a flat bed without using bedrails?: A Little Help needed moving from lying on your back to sitting on the side of a flat bed without using bedrails?: A Little Help needed moving to and from a bed to a chair (including a wheelchair)?: A Little Help needed standing up from a chair using your arms (e.g., wheelchair or bedside chair)?: A Little Help needed to walk in hospital room?: A Little Help needed climbing 3-5 steps with a railing? : A Lot 6 Click Score: 17    End of Session Equipment Utilized During Treatment: Gait belt Activity Tolerance: Patient limited by fatigue;Treatment limited secondary to medical complications (Comment) Patient left: in chair;with call bell/phone within reach;with chair alarm set Nurse  Communication: Mobility status PT Visit Diagnosis: Unsteadiness on feet (R26.81);Muscle weakness (generalized) (M62.81);Difficulty in walking, not elsewhere classified (R26.2)    Time: 1638-4665 PT Time Calculation (min) (ACUTE ONLY): 35 min   Charges:   PT Evaluation $PT Eval Moderate Complexity: 1 Mod PT Treatments $Gait Training: 8-22 mins       Ramond Dial 11/17/2020, 6:04 PM Mee Hives, PT MS Acute Rehab Dept. Number: [place] [facility] [place]

## 2020-11-17 NOTE — Plan of Care (Signed)
  Problem: Clinical Measurements: Goal: Respiratory complications will improve Outcome: Progressing   Problem: Clinical Measurements: Goal: Cardiovascular complication will be avoided Outcome: Progressing   Problem: Pain Managment: Goal: General experience of comfort will improve Outcome: Progressing   Problem: Skin Integrity: Goal: Risk for impaired skin integrity will decrease Outcome: Progressing   

## 2020-11-17 NOTE — Progress Notes (Signed)
PROGRESS NOTE    Stefanie Houston  QIW:[id number] DOB: [birth year] DOA: 11/12/2020 PCP: Rudene Anda, MD   Brief Narrative:  80 year old female with past history of diabetes mellitus type 2, hypertension, hyperlipidemia, gastroesophageal reflux disease, meningioma, coronary artery disease (S/P cath 2012 with stent placement), who initially presented to [facility] emergency department via EMS due to concerns for the husband for progressive paranoid delusions.  Upon initial evaluation of the patient on 2/24, patient was found to be hypoglycemic and initially refused insulin due to reported insulin allergy.  Other than that, patient continued to exhibit delusions of paranoia as mentioned above.  Behavioral health was contacted and arrangements were made for the patient to eventually dispositioned for further psychiatric care.  While patient continued to wait for psychiatric bed for approximately 48 hours unfortunately on 2/26, the patient began to exhibit progressively worsening tachycardia with intermittent complaints of chest discomfort and shortness of breath. Tachycardia did not seem to improve after initiation of 10 mg of IV diltiazem and 150 mg of amiodarone.    Cardiology consulted by ED. Repeat blood work revealed patient was developing acute kidney injury with a creatinine of 2.45.  Patient started to become increasingly hypotensive with systolic blood pressures occasionally in the 80s and blood work also revealed patient was developing a leukocytosis of 14.5 suggesting possible developing infection and she was diagnosed with UTI and subsequently admitted to hospital service.  Assessment & Plan:   Principal Problem:   Sepsis due to gram-negative UTI ([place]) Active Problems:   Wide-complex tachycardia (HCC)   Delusion ([place])   Essential hypertension   Mixed diabetic hyperlipidemia associated with type 2 diabetes mellitus (HCC)   Chest pain   Acute renal failure superimposed on stage 3a  chronic kidney disease (HCC)   Dehydration with hyponatremia   Coronary artery disease involving native coronary artery of native heart   GERD without esophagitis   Lactic acidosis   Hypomagnesemia   Uncontrolled type 2 diabetes mellitus with hyperglycemia, without long-term current use of insulin (HCC)   Severe Sepsis secondary to UTI: Patient met sepsis criteria based on leukocytosis, tachycardia and tachypnea and lactic acid of 2.8.  All cultures negative thus far.  Follow them.  Sepsis physiology resolved.  Leukocytosis improved.  She is afebrile.  Continue Rocephin and stop after tomorrow's dose.  Cholelithiasis: CT abdomen and pelvis shows gallstones but no signs of cholecystitis.  Patient denied any abdominal pain and she has no tenderness on examination.  Doubt cholecystitis.  No need of ultrasound.  History of CAD/chest pain/elevated troponin/possible SVT with aberrancy: patient has a known history of coronary artery disease status post cardiac catheterization and stent placement 2012. Patient did complain of some chest pain at the time of admission.  No pain today.  She did have some vomiting last night along with some hypotension.  There was some question about possible wide-complex QRS tachycardia as well.  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  Beta-blocker stopped by cardiology. Echo without WMA.  Has grade 1 diastolic dysfunction and normal ejection fraction.  Continue statin and aspirin.  Acute toxic encephalopathy: Secondary to sepsis/UTI.  Patient's mental status has improved and she is back to her baseline, alert and oriented.  Verified by husband.  Uncontrolled type 2 diabetes mellitus with hyperglycemia: Reportedly patient was not taking any of her insulin and she believes that she is allergic to insulin.  This was verified with patient's PCP by ED physician and it was clarified that patient is  not allergic to insulin any longer.  Blood sugar finally controlled.   Continue current dose of Lantus 20 units and SSI.  AKI on CKD stage IIIa/dehydration: Patient's baseline creatinine seems to be around 1.3.  She was admitted with creatinine 2.45 which has improved significantly and is back to baseline.  Paranoid delusion: Evaluated by psychiatry in the ED and was waiting for bed at behavioral health.  I was notified by case manager that behavioral health does not have a bed and they are referring her to outside facilities.  I have reconsulted psychiatry to reassess for that.  Per husband, she does have paranoia and he wants this to be handled and taking care of before she comes home.  He has a lot of concerns.  He is advised to talk to psychiatry.  Essential hypertension: All anti-hypertensives on hold due to intermittent hypotension.  Blood pressure now much better than earlier.  Monitor closely.  GERD without esophagitis: Continue PPI.  DVT prophylaxis: enoxaparin (LOVENOX) injection 30 mg Start: 11/15/20 1000   Code Status: Full Code  Family Communication:  None present at bedside.  Discussed with husband over the phone in detail.  Status is: Inpatient  Remains inpatient appropriate because:Inpatient level of care appropriate due to severity of illness   Dispo: The patient is from: Home              Anticipated d/c is to: [facility], waiting for bed placement.  TOC aware.              Patient currently is medically stable to d/c.   Difficult to place patient No        Estimated body mass index is 40.24 kg/m as calculated from the following:   Height as of this encounter: '5\' 2"'  (1.575 m).   Weight as of this encounter: 99.8 kg.  Pressure Injury 11/15/20 Buttocks Right;Left Deep Tissue Pressure Injury - Purple or maroon localized area of discolored intact skin or blood-filled blister due to damage of underlying soft tissue from pressure and/or shear. Purple blanchable (Active)  11/15/20 0930  Location: Buttocks  Location Orientation: Right;Left   Staging: Deep Tissue Pressure Injury - Purple or maroon localized area of discolored intact skin or blood-filled blister due to damage of underlying soft tissue from pressure and/or shear.  Wound Description (Comments): Purple blanchable  Present on Admission: Yes     Nutritional status:               Consultants:   Cardiology  Procedures:   None  Antimicrobials:  Anti-infectives (From admission, onward)   Start     Dose/Rate Route Frequency Ordered Stop   11/14/20 2300  cefTRIAXone (ROCEPHIN) 1 g in sodium chloride 0.9 % 100 mL IVPB        1 g 200 mL/hr over 30 Minutes Intravenous Every 24 hours 11/14/20 2257           Subjective: Patient seen and examined.  Significant improvement.  Completely alert and oriented and has no complaints.  Objective: Vitals:   11/16/20 1204 11/16/20 2315 11/17/20 0747 11/17/20 1158  BP: (!) 160/75 (!) 144/72 (!) 156/78 (!) 149/71  Pulse: 100 99 93 98  Resp: '17 18 17 17  ' Temp: 98.6 F (37 C) 98.5 F (36.9 C) 98.3 F (36.8 C) 98.3 F (36.8 C)  TempSrc: Oral Oral Oral Oral  SpO2: 99%  99% 99%  Weight:      Height:        Intake/Output Summary (  Last 24 hours) at 11/17/2020 1329 Last data filed at 11/17/2020 0850 Gross per 24 hour  Intake 240 ml  Output 600 ml  Net -360 ml   Filed Weights   11/12/20 1929  Weight: 99.8 kg    Examination:  General exam: Appears calm and comfortable  Respiratory system: Clear to auscultation. Respiratory effort normal. Cardiovascular system: S1 & S2 heard, RRR. No JVD, murmurs, rubs, gallops or clicks. No pedal edema. Gastrointestinal system: Abdomen is nondistended, soft and nontender. No organomegaly or masses felt. Normal bowel sounds heard. Central nervous system: Alert and oriented. No focal neurological deficits. Extremities: Symmetric 5 x 5 power. Skin: No rashes, lesions or ulcers.  Psychiatry: Judgement and insight appear poor,  Data Reviewed: I have personally reviewed  following labs and imaging studies  CBC: Recent Labs  Lab 11/12/20 1804 11/12/20 1903 11/14/20 2008 11/15/20 0101 11/16/20 0325 11/17/20 0305  WBC 8.3  --  14.5* 14.9* 10.9* 9.4  NEUTROABS  --   --   --  12.7* 6.4 5.8  HGB 13.5 13.6 14.0 13.2 11.7* 12.1  HCT 41.6 40.0 43.1 40.1 36.3 37.5  MCV 91.6  --  92.9 93.0 93.8 93.1  PLT 247  --  221 198 157 591   Basic Metabolic Panel: Recent Labs  Lab 11/14/20 0950 11/14/20 2008 11/14/20 2156 11/15/20 0101 11/16/20 0325 11/17/20 0305  NA 133* 133*  --  132* 139 137  K 3.6 3.4*  --  3.9 4.3 4.0  CL 100 98  --  101 107 105  CO2 20* 22  --  17* 22 24  GLUCOSE 365* 254*  --  395* 208* 134*  BUN 23 30*  --  29* 28* 18  CREATININE 1.57* 2.45*  --  2.21* 1.54* 1.10*  CALCIUM 8.6* 8.8*  --  7.7* 8.2* 8.3*  MG  --   --  1.4* 1.7  --   --    GFR: Estimated Creatinine Clearance: 46.6 mL/min (A) (by C-G formula based on SCr of 1.1 mg/dL (H)). Liver Function Tests: Recent Labs  Lab 11/15/20 0101 11/16/20 0325  AST 37 23  ALT 29 23  ALKPHOS 54 42  BILITOT 1.1 0.6  PROT 6.7 5.9*  ALBUMIN 2.8* 2.5*   No results for input(s): LIPASE, AMYLASE in the last 168 hours. No results for input(s): AMMONIA in the last 168 hours. Coagulation Profile: Recent Labs  Lab 11/15/20 0101  INR 1.1   Cardiac Enzymes: No results for input(s): CKTOTAL, CKMB, CKMBINDEX, TROPONINI in the last 168 hours. BNP (last 3 results) No results for input(s): PROBNP in the last 8760 hours. HbA1C: No results for input(s): HGBA1C in the last 72 hours. CBG: Recent Labs  Lab 11/16/20 1156 11/16/20 1603 11/16/20 2001 11/17/20 0745 11/17/20 1156  GLUCAP 162* 167* 138* 139* 147*   Lipid Profile: No results for input(s): CHOL, HDL, LDLCALC, TRIG, CHOLHDL, LDLDIRECT in the last 72 hours. Thyroid Function Tests: Recent Labs    11/15/20 0101  TSH 1.138   Anemia Panel: Recent Labs    11/15/20 0101  VITAMINB12 184  FOLATE 7.1   Sepsis Labs: Recent  Labs  Lab 11/14/20 2124 11/15/20 0101 11/16/20 0857 11/16/20 1139  PROCALCITON  --  <0.10  --   --   LATICACIDVEN 2.8* 2.4* 1.4 1.7    Recent Results (from the past 240 hour(s))  Resp Panel by RT-PCR (Flu A&B, Covid) Nasopharyngeal Swab     Status: None   Collection Time: 11/14/20 10:21 PM  Specimen: Nasopharyngeal Swab; Nasopharyngeal(NP) swabs in vial transport medium  Result Value Ref Range Status   SARS Coronavirus 2 by RT PCR NEGATIVE NEGATIVE Final    Comment: (NOTE) SARS-CoV-2 target nucleic acids are NOT DETECTED.  The SARS-CoV-2 RNA is generally detectable in upper respiratory specimens during the acute phase of infection. The lowest concentration of SARS-CoV-2 viral copies this assay can detect is 138 copies/mL. A negative result does not preclude SARS-Cov-2 infection and should not be used as the sole basis for treatment or other patient management decisions. A negative result may occur with  improper specimen collection/handling, submission of specimen other than nasopharyngeal swab, presence of viral mutation(s) within the areas targeted by this assay, and inadequate number of viral copies(<138 copies/mL). A negative result must be combined with clinical observations, patient history, and epidemiological information. The expected result is Negative.  Fact Sheet for Patients:  EntrepreneurPulse.com.au  Fact Sheet for Healthcare Providers:  IncredibleEmployment.be  This test is no t yet approved or cleared by the [place] FDA and  has been authorized for detection and/or diagnosis of SARS-CoV-2 by FDA under an Emergency Use Authorization (EUA). This EUA will remain  in effect (meaning this test can be used) for the duration of the COVID-19 declaration under Section 564(b)(1) of the Act, 21 U.S.C.section 360bbb-3(b)(1), unless the authorization is terminated  or revoked sooner.       Influenza A by PCR NEGATIVE  NEGATIVE Final   Influenza B by PCR NEGATIVE NEGATIVE Final    Comment: (NOTE) The Xpert Xpress SARS-CoV-2/FLU/RSV plus assay is intended as an aid in the diagnosis of influenza from Nasopharyngeal swab specimens and should not be used as a sole basis for treatment. Nasal washings and aspirates are unacceptable for Xpert Xpress SARS-CoV-2/FLU/RSV testing.  Fact Sheet for Patients: EntrepreneurPulse.com.au  Fact Sheet for Healthcare Providers: IncredibleEmployment.be  This test is not yet approved or cleared by the [place] FDA and has been authorized for detection and/or diagnosis of SARS-CoV-2 by FDA under an Emergency Use Authorization (EUA). This EUA will remain in effect (meaning this test can be used) for the duration of the COVID-19 declaration under Section 564(b)(1) of the Act, 21 U.S.C. section 360bbb-3(b)(1), unless the authorization is terminated or revoked.  Performed at [facility], [place] [address] [place], [place] [address]   Blood culture (routine x 2)     Status: None (Preliminary result)   Collection Time: 11/15/20 12:41 AM   Specimen: BLOOD  Result Value Ref Range Status   Specimen Description BLOOD LEFT ARM  Final   Special Requests   Final    BOTTLES DRAWN AEROBIC AND ANAEROBIC Blood Culture results may not be optimal due to an inadequate volume of blood received in culture bottles   Culture   Final    NO GROWTH 2 DAYS Performed at [facility], [place] [address] [place], [place] [address]    Report Status PENDING  Incomplete  Blood culture (routine x 2)     Status: None (Preliminary result)   Collection Time: 11/15/20  1:01 AM   Specimen: BLOOD  Result Value Ref Range Status   Specimen Description BLOOD RIGHT HAND  Final   Special Requests   Final    BOTTLES DRAWN AEROBIC ONLY Blood Culture results may not be optimal due to an inadequate volume of blood received in culture bottles   Culture   Final     NO GROWTH 2 DAYS Performed at [facility],  [address] [place], [place] [address]    Report Status PENDING  Incomplete      Radiology Studies: CT ABDOMEN PELVIS WO CONTRAST  Result Date: 11/15/2020 CLINICAL DATA:  80 year old female with history of renal failure and sepsis. Pyelonephritis. EXAM: CT ABDOMEN AND PELVIS WITHOUT CONTRAST TECHNIQUE: Multidetector CT imaging of the abdomen and pelvis was performed following the standard protocol without IV contrast. COMPARISON:  CT the abdomen and pelvis 07/25/2020. FINDINGS: Lower chest: Small right pleural effusion lying dependently. Atherosclerotic calcifications in the descending thoracic aorta as well as the left anterior descending, left circumflex and right coronary arteries. Calcifications of the aortic valve. Hepatobiliary: Heterogeneous areas of low attenuation are noted throughout the hepatic parenchyma, indicative of heterogeneous hepatic steatosis. Large 3.7 x 3.1 cm low-attenuation lesion in the left lobe of the liver, previously characterized as a simple cyst. No other definite suspicious hepatic lesions are noted on today's noncontrast CT examination. Amorphous high attenuation material lying dependently in the gallbladder which may reflect biliary sludge and/or innumerable tiny calcified gallstones. Gallbladder is not distended. No gallbladder wall thickening. Trace amount of pericholecystic fluid, without overt surrounding inflammatory changes. Pancreas: No definite pancreatic mass or peripancreatic fluid collections or inflammatory changes noted on today's noncontrast CT examination. Spleen: Unremarkable. Adrenals/Urinary Tract: Unenhanced appearance of the kidneys and bilateral adrenal glands is unremarkable. No hydroureteronephrosis. Urinary bladder is normal in appearance. Stomach/Bowel: Unenhanced appearance of the stomach is normal. No pathologic dilatation of small bowel or colon. Numerous colonic diverticulae are noted,  without surrounding inflammatory changes to suggest an acute diverticulitis at this time. The appendix is not confidently identified and may be surgically absent. Regardless, there are no inflammatory changes noted adjacent to the cecum to suggest the presence of an acute appendicitis at this time. Vascular/Lymphatic: Aortic atherosclerosis. No lymphadenopathy noted in the abdomen or pelvis. Reproductive: Uterus and ovaries are unremarkable in appearance. Other: Trace volume of ascites.  No pneumoperitoneum. Musculoskeletal: No aggressive appearing lytic or blastic lesions are noted in the visualized portions of the skeleton. IMPRESSION: 1. Biliary sludge and/or tiny calcified gallstones lying dependently in the gallbladder. Gallbladder is not distended and there is no gallbladder wall thickening. There is a trace amount of pericholecystic fluid, however, there are no overt surrounding inflammatory changes. If there is clinical concern for acute cholecystitis, further evaluation with right upper quadrant ultrasound should be considered. 2. Trace volume of ascites. 3. No other acute findings are noted in the abdomen or pelvis. 4. Colonic diverticulosis without evidence of acute diverticulitis at this time. 5. Small right pleural effusion lying dependently. 6. Aortic atherosclerosis, in addition to 3 vessel coronary artery disease. Assessment for potential risk factor modification, dietary therapy or pharmacologic therapy may be warranted, if clinically indicated. 7. There are calcifications of the aortic valve. Echocardiographic correlation for evaluation of potential valvular dysfunction may be warranted if clinically indicated. 8. Additional incidental findings, as above. Electronically Signed   By: Vinnie Langton M.D.   On: 11/15/2020 14:19   ECHOCARDIOGRAM COMPLETE  Result Date: 11/16/2020    ECHOCARDIOGRAM REPORT   Patient Name:   Stefanie Houston Date of Exam: 11/16/2020 Medical Rec #:  [id number]     Height:        62.0 in Accession #:    [id number]    Weight:       220.0 lb Date of Birth:  01/01/1941     BSA:          1.991 m Patient Age:  80 years      BP:           136/68 mmHg Patient Gender: F             HR:           103 bpm. Exam Location:  Inpatient Procedure: 2D Echo, Cardiac Doppler and Color Doppler Indications:    Chest Pain R07.9  History:        Patient has no prior history of Echocardiogram examinations.                 CAD; Risk Factors:Hypertension, Diabetes, Dyslipidemia and                 Non-Smoker.  Sonographer:    Vickie Epley RDCS Referring Phys: [id number] [facility]  Sonographer Comments: Image acquisition challenging due to patient body habitus. Refused definity. IMPRESSIONS  1. Left ventricular ejection fraction, by estimation, is 55 to 60%. The left ventricle has normal function. The left ventricle has no regional wall motion abnormalities. Left ventricular diastolic parameters are consistent with Grade I diastolic dysfunction (impaired relaxation).  2. Right ventricular systolic function is moderately reduced. The right ventricular size is moderately enlarged. There is moderately elevated pulmonary artery systolic pressure. The estimated right ventricular systolic pressure is 09.3 mmHg.  3. Right atrial size was mildly dilated.  4. The mitral valve is normal in structure. Mild mitral valve regurgitation. No evidence of mitral stenosis.  5. Tricuspid valve regurgitation is moderate to severe.  6. The aortic valve is normal in structure. There is moderate calcification of the aortic valve. There is moderate thickening of the aortic valve. Aortic valve regurgitation is trivial. Mild to moderate aortic valve sclerosis/calcification is present, without any evidence of aortic stenosis.  7. The inferior vena cava is dilated in size with <50% respiratory variability, suggesting right atrial pressure of 15 mmHg. FINDINGS  Left Ventricle: Left ventricular ejection fraction, by estimation, is 55 to  60%. The left ventricle has normal function. The left ventricle has no regional wall motion abnormalities. The left ventricular internal cavity size was normal in size. There is  no left ventricular hypertrophy. Left ventricular diastolic parameters are consistent with Grade I diastolic dysfunction (impaired relaxation). Normal left ventricular filling pressure. Right Ventricle: The right ventricular size is moderately enlarged. No increase in right ventricular wall thickness. Right ventricular systolic function is moderately reduced. There is moderately elevated pulmonary artery systolic pressure. The tricuspid  regurgitant velocity is 3.29 m/s, and with an assumed right atrial pressure of 15 mmHg, the estimated right ventricular systolic pressure is 81.8 mmHg. Left Atrium: Left atrial size was normal in size. Right Atrium: Right atrial size was mildly dilated. Pericardium: There is no evidence of pericardial effusion. Mitral Valve: The mitral valve is normal in structure. Mild mitral valve regurgitation. No evidence of mitral valve stenosis. Tricuspid Valve: The tricuspid valve is normal in structure. Tricuspid valve regurgitation is moderate to severe. No evidence of tricuspid stenosis. Aortic Valve: The aortic valve is normal in structure. There is moderate calcification of the aortic valve. There is moderate thickening of the aortic valve. Aortic valve regurgitation is trivial. Mild to moderate aortic valve sclerosis/calcification is present, without any evidence of aortic stenosis. Pulmonic Valve: The pulmonic valve was normal in structure. Pulmonic valve regurgitation is not visualized. No evidence of pulmonic stenosis. Aorta: The aortic root is normal in size and structure. Venous: The inferior vena cava is dilated in size with less than 50% respiratory variability,  suggesting right atrial pressure of 15 mmHg. IAS/Shunts: No atrial level shunt detected by color flow Doppler.  LEFT VENTRICLE PLAX 2D LVIDd:          4.50 cm     Diastology LVIDs:         3.00 cm     LV e' medial:    5.44 cm/s LV PW:         0.90 cm     LV E/e' medial:  11.9 LV IVS:        0.90 cm     LV e' lateral:   7.07 cm/s LVOT diam:     2.20 cm     LV E/e' lateral: 9.1 LV SV:         38 LV SV Index:   19 LVOT Area:     3.80 cm  LV Volumes (MOD) LV vol d, MOD A4C: 90.0 ml LV vol s, MOD A4C: 47.3 ml LV SV MOD A4C:     90.0 ml RIGHT VENTRICLE RV S prime:     9.57 cm/s TAPSE (M-mode): 1.6 cm LEFT ATRIUM             Index       RIGHT ATRIUM           Index LA diam:        3.00 cm 1.51 cm/m  RA Area:     13.10 cm LA Vol (A2C):   21.4 ml 10.75 ml/m RA Volume:   31.80 ml  15.97 ml/m LA Vol (A4C):   21.8 ml 10.95 ml/m LA Biplane Vol: 22.6 ml 11.35 ml/m  AORTIC VALVE LVOT Vmax:   67.20 cm/s LVOT Vmean:  50.900 cm/s LVOT VTI:    0.100 m  AORTA Ao Root diam: 3.00 cm MITRAL VALVE               TRICUSPID VALVE MV Area (PHT): 8.25 cm    TR Peak grad:   43.3 mmHg MV Decel Time: 92 msec     TR Vmax:        329.00 cm/s MV E velocity: 64.50 cm/s MV A velocity: 89.70 cm/s  SHUNTS MV E/A ratio:  0.72        Systemic VTI:  0.10 m                            Systemic Diam: 2.20 cm Ena Dawley MD Electronically signed by Ena Dawley MD Signature Date/Time: 11/16/2020/11:58:16 AM    Final     Scheduled Meds: . aspirin EC  81 mg Oral Daily  . atorvastatin  20 mg Oral Daily  . enoxaparin (LOVENOX) injection  30 mg Subcutaneous Daily  . insulin aspart  0-15 Units Subcutaneous TID WC  . insulin aspart  4 Units Subcutaneous TID WC  . insulin glargine  20 Units Subcutaneous QHS  . pantoprazole  40 mg Oral Daily  . psyllium  1 packet Oral Daily   Continuous Infusions: . cefTRIAXone (ROCEPHIN)  IV 1 g (11/16/20 2150)     LOS: 3 days   Time spent: 28 minutes   Darliss Cheney, MD [facility]  11/17/2020, 1:29 PM   To contact the attending provider between 7A-7P or the covering provider during after hours 7P-7A, please log into the web site  [email or web address].

## 2020-11-18 DIAGNOSIS — N390 Urinary tract infection, site not specified: Secondary | ICD-10-CM | POA: Diagnosis not present

## 2020-11-18 DIAGNOSIS — A4150 Gram-negative sepsis, unspecified: Secondary | ICD-10-CM | POA: Diagnosis not present

## 2020-11-18 LAB — GLUCOSE, CAPILLARY
Glucose-Capillary: 156 mg/dL — ABNORMAL HIGH (ref 70–99)
Glucose-Capillary: 271 mg/dL — ABNORMAL HIGH (ref 70–99)

## 2020-11-18 MED ORDER — ATORVASTATIN CALCIUM 20 MG PO TABS: 20.0000 mg | ORAL_TABLET | Freq: Every day | ORAL | 1 refills | Status: DC

## 2020-11-18 MED ORDER — LISINOPRIL 10 MG PO TABS: 10.0000 mg | ORAL_TABLET | Freq: Every day | ORAL | 1 refills | Status: DC

## 2020-11-18 MED ORDER — INSULIN GLARGINE 100 UNIT/ML ~~LOC~~ SOLN: 20.0000 [IU] | Freq: Every day | SUBCUTANEOUS | 1 refills | Status: DC

## 2020-11-18 MED ORDER — BLOOD GLUCOSE MONITOR KIT: PACK | 0 refills | Status: DC

## 2020-11-18 MED ORDER — GLIMEPIRIDE 4 MG PO TABS: 8.0000 mg | ORAL_TABLET | Freq: Every day | ORAL | 1 refills | Status: DC

## 2020-11-18 MED ORDER — ESOMEPRAZOLE MAGNESIUM 40 MG PO CPDR: 40.0000 mg | DELAYED_RELEASE_CAPSULE | Freq: Every morning | ORAL | 1 refills | Status: DC

## 2020-11-18 MED ORDER — ASPIRIN 81 MG PO TBEC: 81.0000 mg | DELAYED_RELEASE_TABLET | Freq: Every day | ORAL | 1 refills | Status: DC

## 2020-11-18 MED ORDER — INSULIN STARTER KIT- PEN NEEDLES (ENGLISH)
1.0000 | Freq: Once | Status: DC
  Filled 2020-11-18: qty 1

## 2020-11-18 NOTE — Discharge Summary (Signed)
Physician Discharge Summary  Stefanie Houston MBE:[id number] DOB: 1941-07-20 DOA: 11/12/2020  PCP: Rudene Anda, MD  Admit date: 11/12/2020 Discharge date: 11/18/2020 30 Day Unplanned Readmission Risk Score   Flowsheet Row ED to Hosp-Admission (Current) from 11/12/2020 in [facility]  30 Day Unplanned Readmission Risk Score (%) 17.3 Filed at 11/18/2020 0801     This score is the patient's risk of an unplanned readmission within 30 days of being discharged (0 -100%). The score is based on dignosis, age, lab data, medications, orders, and past utilization.   Low:  0-14.9   Medium: 15-21.9   High: 22-29.9   Extreme: 30 and above         Admitted From: Home Disposition: Home  Recommendations for Outpatient Follow-up:  1. Follow up with PCP in 1-2 weeks 2. Repeat EKG in 1 month to follow-up on prolonged QTC 3. Follow-up with neurology in 1 month for meningioma and dementia work-up 4. Follow-up with psychiatry in 1 month for paranoid disorder 5. Please obtain BMP/CBC in one week 6. Please follow up with your PCP on the following pending results: Unresulted Labs (From admission, onward)          Start     Ordered   11/14/20 2121  Urine Culture  Once,   STAT        11/14/20 2120            Home Health: Yes Equipment/Devices: Tub/shower seat  Discharge Condition:Stable CODE STATUS: Full code Diet recommendation: Cardiac  Subjective: Seen and examined.  Fully alert and oriented.  No complaints.  Brief/Interim Summary: 80 year old female with past history of diabetes mellitus type 2, hypertension, hyperlipidemia, gastroesophageal reflux disease, meningioma, coronary artery disease(S/P cath 2012 with stent placement),who initially presented to [facility] emergency department via EMS due to concerns for the husband for progressive paranoid delusions.  Upon initial evaluation of the patient on 2/24, patient was found to be hypoglycemic and initially refused  insulin due to reported insulin allergy. Other than that, patient continued to exhibit delusions of paranoia as mentioned above. Behavioral health was contacted and arrangements were made for the patient to eventually dispositioned for further psychiatric care.  While patient continued to wait for psychiatric bed for approximately 48 hours unfortunately on 2/26, the patient began to exhibit progressively worsening tachycardia with intermittent complaints of chest discomfort and shortness of breath.Tachycardia did not seem to improve after initiation of 10 mg of IV diltiazem and 150 mg of amiodarone.   Cardiology consulted by ED.Repeat blood work revealed patient was developing acute kidney injury with a creatinine of 2.45. Patient started to become increasingly hypotensive with systolic blood pressures occasionally in the 80s and blood work also revealed patient was developing a leukocytosis of 14.5 suggesting possible developing infection and she was diagnosed with UTI and subsequently admitted to hospital service.  She was continued on Rocephin.  Blood and urine culture remained negative.  She completed 3 days of Rocephin.  With this treatment, patient's mental status improved and she is now back to her baseline of being alert and oriented.  We had not noticed any episode of paranoia.  Since she presented with severe sepsis, CT abdomen and pelvis was done which showed cholelithiasis but no suspicion of cholecystitis.  Patient did not have any clinical features of cholecystitis.  Cardiology thought that she was having wide-complex tachycardia which resolved and it was likely secondary to sepsis.  Her troponins were slightly elevated but flat consistent with  type II ischemia.  They did not recommend any other medications but recommended continuing aspirin.  She has follow-up with her cardiologist.  She also had uncontrolled type 2 diabetes mellitus since she was not taking insulin due to her paranoia.  She  also had commented that she was allergic to insulin however ED physician had talked to patient's prior PCP who had confirmed that patient does not have any allergy to insulin so she was started on sliding's insulin and Lantus which was escalated accordingly.  Once she had medically improved, her mental status had improved, psychiatry was reconsulted and repeat consultation stated that patient does not seem to be in any imminent danger to herself or others and thus they did not recommend admission to inpatient psychiatry unit and recommended discharging her home.  They recommended Seroquel at night if her QTC is normal.  Unfortunately her QTC was prolonged.  I discussed with psychiatry and they stated that due to prolonged QTC, they do not recommend any antipsychotic.  However I recommend that her EKG should be done by her PCP in a month or 2 to follow-up on her QTC and she should follow-up with psychiatry in a month.  Her husband tells me that patient does not like her current PCP and would like to change it.  He has not been able to get any appointment within a month so I have prescribed all patient's prior medications for at least 2 months duration.  Patient is going to be discharged in stable condition today to home with home health per PT recommendations.  I have had a lengthy discussion with her husband every day and including today and he is agreeable with the discharge plan.  Psychiatry also had talked to the husband yesterday over the phone.  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[place])   Essential hypertension   Mixed diabetic hyperlipidemia associated with type 2 diabetes mellitus (HCC)   Chest pain   Acute renal failure superimposed on stage 3a chronic kidney disease (HCC)   Dehydration with hyponatremia   Coronary artery disease involving native coronary artery of native heart    GERD without esophagitis   Lactic acidosis   Hypomagnesemia   Uncontrolled type 2 diabetes mellitus with hyperglycemia, without long-term current use of insulin ([place])    Discharge Instructions   Allergies as of 11/18/2020      Reactions   Insulins Other (See Comments)   Per patient: "made me feel crazy, I don't like that feeling anymore" UPDATE 2/26 - ER provider called PCP and they report no known evidence of insulin allergy.   Penicillins Other (See Comments)   Per patient, childhood reaction      Medication List    STOP taking these medications   aspirin 325 MG tablet Replaced by: aspirin 81 MG EC tablet     TAKE these medications   acetaminophen 325 MG tablet Commonly known as: TYLENOL Take 650 mg by mouth every 6 (six) hours as needed for mild pain or headache.   aspirin 81 MG EC tablet Take 1 tablet (81 mg total) by mouth daily. Swallow whole. Start taking on: November 19, 2020 Replaces: aspirin 325 MG tablet   atorvastatin 20 MG tablet Commonly known as: LIPITOR Take 1 tablet (20 mg total) by mouth daily.   blood glucose meter kit and supplies Kit Dispense based on patient and insurance preference. Use up  to four times daily as directed.   Detrol LA 4 MG 24 hr capsule Generic drug: tolterodine Take 4 mg by mouth daily.   esomeprazole 40 MG capsule Commonly known as: NEXIUM Take 1 capsule (40 mg total) by mouth every morning.   glimepiride 4 MG tablet Commonly known as: AMARYL Take 2 tablets (8 mg total) by mouth daily with breakfast.   guaiFENesin 600 MG 12 hr tablet Commonly known as: MUCINEX Take 600 mg by mouth 2 (two) times daily.   insulin glargine 100 UNIT/ML injection Commonly known as: LANTUS Inject 0.2 mLs (20 Units total) into the skin at bedtime.   lisinopril 10 MG tablet Commonly known as: ZESTRIL Take 1 tablet (10 mg total) by mouth daily.   polyethylene glycol 17 g packet Commonly known as: MIRALAX / GLYCOLAX Take 17 g by mouth daily  as needed for moderate constipation.       Follow-up Information    Rudene Anda, MD Follow up in 1 week(s).   Specialty: Internal Medicine Contact information: [address] [place] [address] [phone]        [place] Follow up in 4 week(s).   Why: f/u for meningioma Contact information: [place], [place] [phone]       [facility] Follow up in 4 week(s).   Why: f/u for paraniod disorder             Allergies  Allergen Reactions  . Insulins Other (See Comments)    Per patient: "made me feel crazy, I don't like that feeling anymore"  UPDATE 2/26 - ER provider called PCP and they report no known evidence of insulin allergy.  . Penicillins Other (See Comments)    Per patient, childhood reaction     Consultations: Psychiatry   Procedures/Studies: CT ABDOMEN PELVIS WO CONTRAST  Result Date: 11/15/2020 CLINICAL DATA:  80 year old female with history of renal failure and sepsis. Pyelonephritis. EXAM: CT ABDOMEN AND PELVIS WITHOUT CONTRAST TECHNIQUE: Multidetector CT imaging of the abdomen and pelvis was performed following the standard protocol without IV contrast. COMPARISON:  CT the abdomen and pelvis 07/25/2020. FINDINGS: Lower chest: Small right pleural effusion lying dependently. Atherosclerotic calcifications in the descending thoracic aorta as well as the left anterior descending, left circumflex and right coronary arteries. Calcifications of the aortic valve. Hepatobiliary: Heterogeneous areas of low attenuation are noted throughout the hepatic parenchyma, indicative of heterogeneous hepatic steatosis. Large 3.7 x 3.1 cm low-attenuation lesion in the left lobe of the liver, previously characterized as a simple cyst. No other definite suspicious hepatic lesions are noted on today's noncontrast CT examination. Amorphous high attenuation material lying dependently in the gallbladder which  may reflect biliary sludge and/or innumerable tiny calcified gallstones. Gallbladder is not distended. No gallbladder wall thickening. Trace amount of pericholecystic fluid, without overt surrounding inflammatory changes. Pancreas: No definite pancreatic mass or peripancreatic fluid collections or inflammatory changes noted on today's noncontrast CT examination. Spleen: Unremarkable. Adrenals/Urinary Tract: Unenhanced appearance of the kidneys and bilateral adrenal glands is unremarkable. No hydroureteronephrosis. Urinary bladder is normal in appearance. Stomach/Bowel: Unenhanced appearance of the stomach is normal. No pathologic dilatation of small bowel or colon. Numerous colonic diverticulae are noted, without surrounding inflammatory changes to suggest an acute diverticulitis at this time. The appendix is not confidently identified and may be surgically absent. Regardless, there are no inflammatory changes noted adjacent to the cecum to suggest the presence of an acute appendicitis at this time.  Vascular/Lymphatic: Aortic atherosclerosis. No lymphadenopathy noted in the abdomen or pelvis. Reproductive: Uterus and ovaries are unremarkable in appearance. Other: Trace volume of ascites.  No pneumoperitoneum. Musculoskeletal: No aggressive appearing lytic or blastic lesions are noted in the visualized portions of the skeleton. IMPRESSION: 1. Biliary sludge and/or tiny calcified gallstones lying dependently in the gallbladder. Gallbladder is not distended and there is no gallbladder wall thickening. There is a trace amount of pericholecystic fluid, however, there are no overt surrounding inflammatory changes. If there is clinical concern for acute cholecystitis, further evaluation with right upper quadrant ultrasound should be considered. 2. Trace volume of ascites. 3. No other acute findings are noted in the abdomen or pelvis. 4. Colonic diverticulosis without evidence of acute diverticulitis at this time. 5. Small  right pleural effusion lying dependently. 6. Aortic atherosclerosis, in addition to 3 vessel coronary artery disease. Assessment for potential risk factor modification, dietary therapy or pharmacologic therapy may be warranted, if clinically indicated. 7. There are calcifications of the aortic valve. Echocardiographic correlation for evaluation of potential valvular dysfunction may be warranted if clinically indicated. 8. Additional incidental findings, as above. Electronically Signed   By: Vinnie Langton M.D.   On: 11/15/2020 14:19   DG Chest Portable 1 View  Result Date: 11/14/2020 CLINICAL DATA:  Chest pain. EXAM: PORTABLE CHEST 1 VIEW COMPARISON:  September 27, 2018. FINDINGS: Stable cardiomediastinal silhouette. No pneumothorax or pleural effusion is noted. Both lungs are clear. The visualized skeletal structures are unremarkable. IMPRESSION: No active disease. Electronically Signed   By: Marijo Conception M.D.   On: 11/14/2020 21:55   DG Abd Portable 2V  Result Date: 11/15/2020 CLINICAL DATA:  Bowel obstruction, vomiting EXAM: PORTABLE ABDOMEN - 2 VIEW COMPARISON:  None. FINDINGS: Lungs are clear. Lung apices are partially obscured, however, no definite pneumothorax. No pleural effusion. Cardiac size within normal limits. Pulmonary vascularity is normal. Surgical clips are seen within the left axilla. Normal abdominal gas pattern. No free intraperitoneal gas. Moderate stool within the distal colon. Inferior pelvis is partially excluded. No organomegaly. Vascular calcifications noted within the pelvis. IMPRESSION: Normal abdominal gas pattern.  Moderate stool. Electronically Signed   By: Fidela Salisbury MD   On: 11/15/2020 02:43   ECHOCARDIOGRAM COMPLETE  Result Date: 11/16/2020    ECHOCARDIOGRAM REPORT   Patient Name:   SIEDAH SEDOR Date of Exam: 11/16/2020 Medical Rec #:  [id number]     Height:       62.0 in Accession #:    [id number]    Weight:       220.0 lb Date of Birth:  Feb 04, 1941     BSA:           1.991 m Patient Age:    56 years      BP:           136/68 mmHg Patient Gender: F             HR:           103 bpm. Exam Location:  Inpatient Procedure: 2D Echo, Cardiac Doppler and Color Doppler Indications:    Chest Pain R07.9  History:        Patient has no prior history of Echocardiogram examinations.                 CAD; Risk Factors:Hypertension, Diabetes, Dyslipidemia and                 Non-Smoker.  Sonographer:    Vickie Epley RDCS  Referring Phys: [id number] [facility]  Sonographer Comments: Image acquisition challenging due to patient body habitus. Refused definity. IMPRESSIONS  1. Left ventricular ejection fraction, by estimation, is 55 to 60%. The left ventricle has normal function. The left ventricle has no regional wall motion abnormalities. Left ventricular diastolic parameters are consistent with Grade I diastolic dysfunction (impaired relaxation).  2. Right ventricular systolic function is moderately reduced. The right ventricular size is moderately enlarged. There is moderately elevated pulmonary artery systolic pressure. The estimated right ventricular systolic pressure is 46.6 mmHg.  3. Right atrial size was mildly dilated.  4. The mitral valve is normal in structure. Mild mitral valve regurgitation. No evidence of mitral stenosis.  5. Tricuspid valve regurgitation is moderate to severe.  6. The aortic valve is normal in structure. There is moderate calcification of the aortic valve. There is moderate thickening of the aortic valve. Aortic valve regurgitation is trivial. Mild to moderate aortic valve sclerosis/calcification is present, without any evidence of aortic stenosis.  7. The inferior vena cava is dilated in size with <50% respiratory variability, suggesting right atrial pressure of 15 mmHg. FINDINGS  Left Ventricle: Left ventricular ejection fraction, by estimation, is 55 to 60%. The left ventricle has normal function. The left ventricle has no regional wall motion abnormalities. The  left ventricular internal cavity size was normal in size. There is  no left ventricular hypertrophy. Left ventricular diastolic parameters are consistent with Grade I diastolic dysfunction (impaired relaxation). Normal left ventricular filling pressure. Right Ventricle: The right ventricular size is moderately enlarged. No increase in right ventricular wall thickness. Right ventricular systolic function is moderately reduced. There is moderately elevated pulmonary artery systolic pressure. The tricuspid  regurgitant velocity is 3.29 m/s, and with an assumed right atrial pressure of 15 mmHg, the estimated right ventricular systolic pressure is 59.9 mmHg. Left Atrium: Left atrial size was normal in size. Right Atrium: Right atrial size was mildly dilated. Pericardium: There is no evidence of pericardial effusion. Mitral Valve: The mitral valve is normal in structure. Mild mitral valve regurgitation. No evidence of mitral valve stenosis. Tricuspid Valve: The tricuspid valve is normal in structure. Tricuspid valve regurgitation is moderate to severe. No evidence of tricuspid stenosis. Aortic Valve: The aortic valve is normal in structure. There is moderate calcification of the aortic valve. There is moderate thickening of the aortic valve. Aortic valve regurgitation is trivial. Mild to moderate aortic valve sclerosis/calcification is present, without any evidence of aortic stenosis. Pulmonic Valve: The pulmonic valve was normal in structure. Pulmonic valve regurgitation is not visualized. No evidence of pulmonic stenosis. Aorta: The aortic root is normal in size and structure. Venous: The inferior vena cava is dilated in size with less than 50% respiratory variability, suggesting right atrial pressure of 15 mmHg. IAS/Shunts: No atrial level shunt detected by color flow Doppler.  LEFT VENTRICLE PLAX 2D LVIDd:         4.50 cm     Diastology LVIDs:         3.00 cm     LV e' medial:    5.44 cm/s LV PW:         0.90 cm      LV E/e' medial:  11.9 LV IVS:        0.90 cm     LV e' lateral:   7.07 cm/s LVOT diam:     2.20 cm     LV E/e' lateral: 9.1 LV SV:  38 LV SV Index:   19 LVOT Area:     3.80 cm  LV Volumes (MOD) LV vol d, MOD A4C: 90.0 ml LV vol s, MOD A4C: 47.3 ml LV SV MOD A4C:     90.0 ml RIGHT VENTRICLE RV S prime:     9.57 cm/s TAPSE (M-mode): 1.6 cm LEFT ATRIUM             Index       RIGHT ATRIUM           Index LA diam:        3.00 cm 1.51 cm/m  RA Area:     13.10 cm LA Vol (A2C):   21.4 ml 10.75 ml/m RA Volume:   31.80 ml  15.97 ml/m LA Vol (A4C):   21.8 ml 10.95 ml/m LA Biplane Vol: 22.6 ml 11.35 ml/m  AORTIC VALVE LVOT Vmax:   67.20 cm/s LVOT Vmean:  50.900 cm/s LVOT VTI:    0.100 m  AORTA Ao Root diam: 3.00 cm MITRAL VALVE               TRICUSPID VALVE MV Area (PHT): 8.25 cm    TR Peak grad:   43.3 mmHg MV Decel Time: 92 msec     TR Vmax:        329.00 cm/s MV E velocity: 64.50 cm/s MV A velocity: 89.70 cm/s  SHUNTS MV E/A ratio:  0.72        Systemic VTI:  0.10 m                            Systemic Diam: 2.20 cm Ena Dawley MD Electronically signed by Ena Dawley MD Signature Date/Time: 11/16/2020/11:58:16 AM    Final       Discharge Exam: Vitals:   11/18/20 0415 11/18/20 0752  BP: 136/67 (!) 159/145  Pulse: 74 84  Resp: 18 18  Temp: 97.9 F (36.6 C) 98.4 F (36.9 C)  SpO2: 98% 99%   Vitals:   11/17/20 2025 11/17/20 2300 11/18/20 0415 11/18/20 0752  BP: 134/66 136/71 136/67 (!) 159/145  Pulse: 91 97 74 84  Resp: '20 20 18 18  ' Temp: 97.8 F (36.6 C) 97.8 F (36.6 C) 97.9 F (36.6 C) 98.4 F (36.9 C)  TempSrc:  Oral    SpO2: 100% 100% 98% 99%  Weight:      Height:        General: Pt is alert, awake, not in acute distress Cardiovascular: RRR, S1/S2 +, no rubs, no gallops Respiratory: CTA bilaterally, no wheezing, no rhonchi Abdominal: Soft, NT, ND, bowel sounds + Extremities: no edema, no cyanosis    The results of significant diagnostics from this hospitalization  (including imaging, microbiology, ancillary and laboratory) are listed below for reference.     Microbiology: Recent Results (from the past 240 hour(s))  Resp Panel by RT-PCR (Flu A&B, Covid) Nasopharyngeal Swab     Status: None   Collection Time: 11/14/20 10:21 PM   Specimen: Nasopharyngeal Swab; Nasopharyngeal(NP) swabs in vial transport medium  Result Value Ref Range Status   SARS Coronavirus 2 by RT PCR NEGATIVE NEGATIVE Final    Comment: (NOTE) SARS-CoV-2 target nucleic acids are NOT DETECTED.  The SARS-CoV-2 RNA is generally detectable in upper respiratory specimens during the acute phase of infection. The lowest concentration of SARS-CoV-2 viral copies this assay can detect is 138 copies/mL. A negative result does not preclude SARS-Cov-2 infection and should  not be used as the sole basis for treatment or other patient management decisions. A negative result may occur with  improper specimen collection/handling, submission of specimen other than nasopharyngeal swab, presence of viral mutation(s) within the areas targeted by this assay, and inadequate number of viral copies(<138 copies/mL). A negative result must be combined with clinical observations, patient history, and epidemiological information. The expected result is Negative.  Fact Sheet for Patients:  EntrepreneurPulse.com.au  Fact Sheet for Healthcare Providers:  IncredibleEmployment.be  This test is no t yet approved or cleared by the [place] FDA and  has been authorized for detection and/or diagnosis of SARS-CoV-2 by FDA under an Emergency Use Authorization (EUA). This EUA will remain  in effect (meaning this test can be used) for the duration of the COVID-19 declaration under Section 564(b)(1) of the Act, 21 U.S.C.section 360bbb-3(b)(1), unless the authorization is terminated  or revoked sooner.       Influenza A by PCR NEGATIVE NEGATIVE Final   Influenza B by PCR  NEGATIVE NEGATIVE Final    Comment: (NOTE) The Xpert Xpress SARS-CoV-2/FLU/RSV plus assay is intended as an aid in the diagnosis of influenza from Nasopharyngeal swab specimens and should not be used as a sole basis for treatment. Nasal washings and aspirates are unacceptable for Xpert Xpress SARS-CoV-2/FLU/RSV testing.  Fact Sheet for Patients: EntrepreneurPulse.com.au  Fact Sheet for Healthcare Providers: IncredibleEmployment.be  This test is not yet approved or cleared by the [place] FDA and has been authorized for detection and/or diagnosis of SARS-CoV-2 by FDA under an Emergency Use Authorization (EUA). This EUA will remain in effect (meaning this test can be used) for the duration of the COVID-19 declaration under Section 564(b)(1) of the Act, 21 U.S.C. section 360bbb-3(b)(1), unless the authorization is terminated or revoked.  Performed at [facility], [place] [address] [place], [place] [address]   Blood culture (routine x 2)     Status: None (Preliminary result)   Collection Time: 11/15/20 12:41 AM   Specimen: BLOOD  Result Value Ref Range Status   Specimen Description BLOOD LEFT ARM  Final   Special Requests   Final    BOTTLES DRAWN AEROBIC AND ANAEROBIC Blood Culture results may not be optimal due to an inadequate volume of blood received in culture bottles   Culture   Final    NO GROWTH 2 DAYS Performed at [facility], [place] [address], [place], [place] [address]    Report Status PENDING  Incomplete  Blood culture (routine x 2)     Status: None (Preliminary result)   Collection Time: 11/15/20  1:01 AM   Specimen: BLOOD  Result Value Ref Range Status   Specimen Description BLOOD RIGHT HAND  Final   Special Requests   Final    BOTTLES DRAWN AEROBIC ONLY Blood Culture results may not be optimal due to an inadequate volume of blood received in culture bottles   Culture   Final    NO GROWTH 2 DAYS Performed at  [facility], [place] [address], [place], [place] [address]    Report Status PENDING  Incomplete     Labs: BNP (last 3 results) Recent Labs    07/25/20 1640 11/15/20 0101  BNP 61.0 70.1   Basic Metabolic Panel: Recent Labs  Lab 11/14/20 0950 11/14/20 2008 11/14/20 2156 11/15/20 0101 11/16/20 0325 11/17/20 0305  NA 133* 133*  --  132* 139 137  K 3.6 3.4*  --  3.9 4.3 4.0  CL 100 98  --  101 107 105  CO2 20* 22  --  17* 22 24  GLUCOSE 365* 254*  --  395* 208* 134*  BUN 23 30*  --  29* 28* 18  CREATININE 1.57* 2.45*  --  2.21* 1.54* 1.10*  CALCIUM 8.6* 8.8*  --  7.7* 8.2* 8.3*  MG  --   --  1.4* 1.7  --   --    Liver Function Tests: Recent Labs  Lab 11/15/20 0101 11/16/20 0325  AST 37 23  ALT 29 23  ALKPHOS 54 42  BILITOT 1.1 0.6  PROT 6.7 5.9*  ALBUMIN 2.8* 2.5*   No results for input(s): LIPASE, AMYLASE in the last 168 hours. No results for input(s): AMMONIA in the last 168 hours. CBC: Recent Labs  Lab 11/12/20 1804 11/12/20 1903 11/14/20 2008 11/15/20 0101 11/16/20 0325 11/17/20 0305  WBC 8.3  --  14.5* 14.9* 10.9* 9.4  NEUTROABS  --   --   --  12.7* 6.4 5.8  HGB 13.5 13.6 14.0 13.2 11.7* 12.1  HCT 41.6 40.0 43.1 40.1 36.3 37.5  MCV 91.6  --  92.9 93.0 93.8 93.1  PLT 247  --  221 198 157 175   Cardiac Enzymes: No results for input(s): CKTOTAL, CKMB, CKMBINDEX, TROPONINI in the last 168 hours. BNP: Invalid input(s): POCBNP CBG: Recent Labs  Lab 11/17/20 0745 11/17/20 1156 11/17/20 1630 11/17/20 2030 11/18/20 0752  GLUCAP 139* 147* 212* 212* 156*   D-Dimer No results for input(s): DDIMER in the last 72 hours. Hgb A1c No results for input(s): HGBA1C in the last 72 hours. Lipid Profile No results for input(s): CHOL, HDL, LDLCALC, TRIG, CHOLHDL, LDLDIRECT in the last 72 hours. Thyroid function studies No results for input(s): TSH, T4TOTAL, T3FREE, THYROIDAB in the last 72 hours.  Invalid input(s): FREET3 Anemia work up No results  for input(s): VITAMINB12, FOLATE, FERRITIN, TIBC, IRON, RETICCTPCT in the last 72 hours. Urinalysis    Component Value Date/Time   COLORURINE AMBER (A) 11/14/2020 2118   APPEARANCEUR CLOUDY (A) 11/14/2020 2118   LABSPEC 1.037 (H) 11/14/2020 2118   PHURINE 5.0 11/14/2020 2118   GLUCOSEU >=500 (A) 11/14/2020 2118   HGBUR SMALL (A) 11/14/2020 2118   BILIRUBINUR NEGATIVE 11/14/2020 2118   KETONESUR NEGATIVE 11/14/2020 2118   PROTEINUR 100 (A) 11/14/2020 2118   NITRITE NEGATIVE 11/14/2020 2118   LEUKOCYTESUR LARGE (A) 11/14/2020 2118   Sepsis Labs Invalid input(s): PROCALCITONIN,  WBC,  LACTICIDVEN Microbiology Recent Results (from the past 240 hour(s))  Resp Panel by RT-PCR (Flu A&B, Covid) Nasopharyngeal Swab     Status: None   Collection Time: 11/14/20 10:21 PM   Specimen: Nasopharyngeal Swab; Nasopharyngeal(NP) swabs in vial transport medium  Result Value Ref Range Status   SARS Coronavirus 2 by RT PCR NEGATIVE NEGATIVE Final    Comment: (NOTE) SARS-CoV-2 target nucleic acids are NOT DETECTED.  The SARS-CoV-2 RNA is generally detectable in upper respiratory specimens during the acute phase of infection. The lowest concentration of SARS-CoV-2 viral copies this assay can detect is 138 copies/mL. A negative result does not preclude SARS-Cov-2 infection and should not be used as the sole basis for treatment or other patient management decisions. A negative result may occur with  improper specimen collection/handling, submission of specimen other than nasopharyngeal swab, presence of viral mutation(s) within the areas targeted by this assay, and inadequate number of viral copies(<138 copies/mL). A negative result must be combined with clinical observations, patient history, and  epidemiological information. The expected result is Negative.  Fact Sheet for Patients:  EntrepreneurPulse.com.au  Fact Sheet for Healthcare Providers:   IncredibleEmployment.be  This test is no t yet approved or cleared by the [place] FDA and  has been authorized for detection and/or diagnosis of SARS-CoV-2 by FDA under an Emergency Use Authorization (EUA). This EUA will remain  in effect (meaning this test can be used) for the duration of the COVID-19 declaration under Section 564(b)(1) of the Act, 21 U.S.C.section 360bbb-3(b)(1), unless the authorization is terminated  or revoked sooner.       Influenza A by PCR NEGATIVE NEGATIVE Final   Influenza B by PCR NEGATIVE NEGATIVE Final    Comment: (NOTE) The Xpert Xpress SARS-CoV-2/FLU/RSV plus assay is intended as an aid in the diagnosis of influenza from Nasopharyngeal swab specimens and should not be used as a sole basis for treatment. Nasal washings and aspirates are unacceptable for Xpert Xpress SARS-CoV-2/FLU/RSV testing.  Fact Sheet for Patients: EntrepreneurPulse.com.au  Fact Sheet for Healthcare Providers: IncredibleEmployment.be  This test is not yet approved or cleared by the [place] FDA and has been authorized for detection and/or diagnosis of SARS-CoV-2 by FDA under an Emergency Use Authorization (EUA). This EUA will remain in effect (meaning this test can be used) for the duration of the COVID-19 declaration under Section 564(b)(1) of the Act, 21 U.S.C. section 360bbb-3(b)(1), unless the authorization is terminated or revoked.  Performed at [facility], [place] [address] [place], [place] [address]   Blood culture (routine x 2)     Status: None (Preliminary result)   Collection Time: 11/15/20 12:41 AM   Specimen: BLOOD  Result Value Ref Range Status   Specimen Description BLOOD LEFT ARM  Final   Special Requests   Final    BOTTLES DRAWN AEROBIC AND ANAEROBIC Blood Culture results may not be optimal due to an inadequate volume of blood received in culture bottles   Culture   Final    NO  GROWTH 2 DAYS Performed at [facility], [place] [address], [place], [place] [address]    Report Status PENDING  Incomplete  Blood culture (routine x 2)     Status: None (Preliminary result)   Collection Time: 11/15/20  1:01 AM   Specimen: BLOOD  Result Value Ref Range Status   Specimen Description BLOOD RIGHT HAND  Final   Special Requests   Final    BOTTLES DRAWN AEROBIC ONLY Blood Culture results may not be optimal due to an inadequate volume of blood received in culture bottles   Culture   Final    NO GROWTH 2 DAYS Performed at [facility], [place] [address], [place], [place] [address]    Report Status PENDING  Incomplete     Time coordinating discharge: Over 30 minutes  SIGNED:   Darliss Cheney, MD  [facility] 11/18/2020, 10:21 AM  If 7PM-7AM, please contact night-coverage [email or web address]

## 2020-11-18 NOTE — Plan of Care (Signed)
Problem: Education: Goal: Knowledge of General Education information will improve Description: Including pain rating scale, medication(s)/side effects and non-pharmacologic comfort measures 11/18/2020 1314 by Morene Rankins, LPN Outcome: Adequate for Discharge 11/18/2020 1313 by Morene Rankins, LPN Outcome: Adequate for Discharge   Problem: Health Behavior/Discharge Planning: Goal: Ability to manage health-related needs will improve 11/18/2020 1314 by Morene Rankins, LPN Outcome: Adequate for Discharge 11/18/2020 1313 by Morene Rankins, LPN Outcome: Adequate for Discharge   Problem: Clinical Measurements: Goal: Ability to maintain clinical measurements within normal limits will improve 11/18/2020 1314 by Morene Rankins, LPN Outcome: Adequate for Discharge 11/18/2020 1313 by Morene Rankins, LPN Outcome: Adequate for Discharge Goal: Will remain free from infection 11/18/2020 1314 by Morene Rankins, LPN Outcome: Adequate for Discharge 11/18/2020 1313 by Morene Rankins, LPN Outcome: Adequate for Discharge Goal: Diagnostic test results will improve 11/18/2020 1314 by Morene Rankins, LPN Outcome: Adequate for Discharge 11/18/2020 1313 by Morene Rankins, LPN Outcome: Adequate for Discharge Goal: Respiratory complications will improve 11/18/2020 1314 by Morene Rankins, LPN Outcome: Adequate for Discharge 11/18/2020 1313 by Morene Rankins, LPN Outcome: Adequate for Discharge Goal: Cardiovascular complication will be avoided 11/18/2020 1314 by Morene Rankins, LPN Outcome: Adequate for Discharge 11/18/2020 1313 by Morene Rankins, LPN Outcome: Adequate for Discharge   Problem: Activity: Goal: Risk for activity intolerance will decrease 11/18/2020 1314 by Morene Rankins, LPN Outcome: Adequate for Discharge 11/18/2020 1313 by Morene Rankins, LPN Outcome: Adequate for Discharge   Problem: Nutrition: Goal: Adequate nutrition will be maintained 11/18/2020 1314 by Morene Rankins,  LPN Outcome: Adequate for Discharge 11/18/2020 1313 by Morene Rankins, LPN Outcome: Adequate for Discharge   Problem: Coping: Goal: Level of anxiety will decrease 11/18/2020 1314 by Morene Rankins, LPN Outcome: Adequate for Discharge 11/18/2020 1313 by Morene Rankins, LPN Outcome: Adequate for Discharge   Problem: Elimination: Goal: Will not experience complications related to bowel motility 11/18/2020 1314 by Morene Rankins, LPN Outcome: Adequate for Discharge 11/18/2020 1313 by Morene Rankins, LPN Outcome: Adequate for Discharge Goal: Will not experience complications related to urinary retention 11/18/2020 1314 by Morene Rankins, LPN Outcome: Adequate for Discharge 11/18/2020 1313 by Morene Rankins, LPN Outcome: Adequate for Discharge   Problem: Pain Managment: Goal: General experience of comfort will improve 11/18/2020 1314 by Morene Rankins, LPN Outcome: Adequate for Discharge 11/18/2020 1313 by Morene Rankins, LPN Outcome: Adequate for Discharge   Problem: Safety: Goal: Ability to remain free from injury will improve 11/18/2020 1314 by Morene Rankins, LPN Outcome: Adequate for Discharge 11/18/2020 1313 by Morene Rankins, LPN Outcome: Adequate for Discharge   Problem: Skin Integrity: Goal: Risk for impaired skin integrity will decrease 11/18/2020 1314 by Morene Rankins, LPN Outcome: Adequate for Discharge 11/18/2020 1313 by Morene Rankins, LPN Outcome: Adequate for Discharge   Problem: Acute Rehab PT Goals(only PT should resolve) Goal: Pt Will Go Supine/Side To Sit Outcome: Adequate for Discharge Goal: Patient Will Transfer Sit To/From Stand Outcome: Adequate for Discharge Goal: Pt Will Transfer Bed To Chair/Chair To Bed Outcome: Adequate for Discharge Goal: Pt Will Perform Standing Balance Or Pre-Gait Outcome: Adequate for Discharge Goal: Pt Will Ambulate Outcome: Adequate for Discharge   Problem: Acute Rehab OT Goals (only OT should resolve) Goal: Pt.  Will Perform Grooming Outcome: Adequate for Discharge Goal: Pt. Will Perform Lower Body Bathing Outcome: Adequate for Discharge Goal: Pt. Will Perform Lower Body Dressing  Outcome: Adequate for Discharge Goal: Pt. Will Transfer To Toilet Outcome: Adequate for Discharge Goal: OT Additional ADL Goal #1 Outcome: Adequate for Discharge

## 2020-11-18 NOTE — Evaluation (Signed)
Occupational Therapy Evaluation Patient Details Name: Stefanie Houston MRN: [id number] DOB: 1940-12-13 Today's Date: 11/18/2020    History of Present Illness 79 yo female with onset of paranoid delusions and hypotension with tachycardia was brought to hosp, now referred to PT. Noted pleural effusion, ascites, cholelithiasis, and diverticulosis.  PMHx:  atherosclerosis, DM, CAD, meningioma,   Clinical Impression   PTA, pt lives with spouse and reports typically Modified Independent with ADLs and mobility using cane vs Rollator though fatigues quickly. Pt presents now with deficits in cardiopulmonary tolerance, strength, balance, cognition and endurance. Pt overall Min A for bed mobility, min guard for straight short mobility using RW with occasional Min A for turning with DME. Pt overall Supervision for ADLs standing at sink though fatigues quickly and required seated rest break for completion of ADLs. Pt requires up to [place] for LB ADLs due to deficits. Educated on energy conservation strategies with reinforcement needed. Pt would benefit from shower seat to maximize ADL independence as pt reports hx of too fatigued to stand for duration of showering task. If spouse able to provide initial 24/7 support and light assist as needed, pt appropriate to DC with HHOT follow-up.   Pt 100% on 2 L O2. Trialed session on RA with SpO2 90% and above throughout, 2/4 DOE.     Follow Up Recommendations  Home health OT;Supervision/Assistance - 24 hour    Equipment Recommendations  Tub/shower seat;Other (comment) (RW)    Recommendations for Other Services       Precautions / Restrictions Precautions Precautions: Fall Restrictions Weight Bearing Restrictions: No      Mobility Bed Mobility Overal bed mobility: Needs Assistance Bed Mobility: Supine to Sit     Supine to sit: Min assist     General bed mobility comments: Min A for trunk advancement with handheld assist    Transfers Overall transfer  level: Needs assistance Equipment used: Rolling walker (2 wheeled) Transfers: Sit to/from Stand Sit to Stand: Min guard         General transfer comment: min guard for power up in standing, Min A for turning with RW to chair (getting wheels stuck on chair, etc)    Balance Overall balance assessment: Needs assistance Sitting-balance support: Bilateral upper extremity supported Sitting balance-Leahy Scale: Good     Standing balance support: Bilateral upper extremity supported;During functional activity Standing balance-Leahy Scale: Fair Standing balance comment: fair static standing at sink, use of B UE support for mobility                           ADL either performed or assessed with clinical judgement   ADL Overall ADL's : Needs assistance/impaired Eating/Feeding: Set up;Sitting Eating/Feeding Details (indicate cue type and reason): assist to open containers Grooming: Supervision/safety;Standing;Sitting;Brushing hair;Wash/dry hands Grooming Details (indicate cue type and reason): Washed hands at sink, fatigued after 1 min so required seated rest break to complete remainder of grooming tasks. Upper Body Bathing: Set up;Sitting   Lower Body Bathing: Minimal assistance;Sit to/from stand   Upper Body Dressing : Set up;Sitting   Lower Body Dressing: Minimal assistance;Sit to/from stand   Toilet Transfer: Minimal assistance;Ambulation;RW Toilet Transfer Details (indicate cue type and reason): Min A for navigation of RW with turns Toileting- Clothing Manipulation and Hygiene: Minimal assistance;Sit to/from stand       Functional mobility during ADLs: Minimal assistance;Rolling walker;Cueing for sequencing;Cueing for safety General ADL Comments: Pt with quick fatigue, educated on use of energy conservation  strategies (chair at sink or use of tabletop mirror for grooming tasks, shower chair for showering tasks, rest breaks, pursed lip breathing)     Vision Patient  Visual Report: No change from baseline Vision Assessment?: No apparent visual deficits     Perception     Praxis      Pertinent Vitals/Pain Pain Assessment: No/denies pain     Hand Dominance Right   Extremity/Trunk Assessment Upper Extremity Assessment Upper Extremity Assessment: Overall WFL for tasks assessed   Lower Extremity Assessment Lower Extremity Assessment: Defer to PT evaluation   Cervical / Trunk Assessment Cervical / Trunk Assessment: Kyphotic   Communication Communication Communication: No difficulties   Cognition Arousal/Alertness: Awake/alert Behavior During Therapy: Flat affect Overall Cognitive Status: No family/caregiver present to determine baseline cognitive functioning Area of Impairment: Following commands;Safety/judgement;Awareness;Problem solving                       Following Commands: Follows one step commands with increased time;Follows multi-step commands with increased time Safety/Judgement: Decreased awareness of deficits;Decreased awareness of safety Awareness: Emergent Problem Solving: Slow processing;Requires verbal cues General Comments: Pt flat affect though pleasant. Follows directions with increased time for processing. Requires consistent cues for safety in use of DME, problem solving DME use   General Comments  Pt 100% on 2 L O2 at rest. Trialed on RA with SpO2 >90% during activity 2/4 DOE, 93% sustained and left on RA at 99% at rest.    Exercises     Shoulder Instructions      Home Living Family/patient expects to be discharged to:: Private residence Living Arrangements: Spouse/significant other Available Help at Discharge: Family;Available 24 hours/day Type of Home: House       Home Layout: One level     Bathroom Shower/Tub: Occupational psychologist: Standard     Home Equipment: Environmental consultant - 4 wheels;Cane - single point          Prior Functioning/Environment Level of Independence: Needs  assistance  Gait / Transfers Assistance Needed: reports use of Rollator that may be too tall for pt for mobility (reports she is too short to sit on seat) or SPC. Sleeps in recliner or couch ADL's / Homemaking Assistance Needed: Reports able to complete ADLs, typically sponge bathes due to fatigue in standing for showering tasks. Husband assists with IADLs and transportation, pt does not drive            OT Problem List: Decreased strength;Decreased activity tolerance;Impaired balance (sitting and/or standing);Decreased cognition;Decreased safety awareness;Decreased knowledge of use of DME or AE;Cardiopulmonary status limiting activity      OT Treatment/Interventions: Self-care/ADL training;Therapeutic exercise;Energy conservation;DME and/or AE instruction;Therapeutic activities;Patient/family education;Balance training    OT Goals(Current goals can be found in the care plan section) Acute Rehab OT Goals Patient Stated Goal: stand longer, be able to go home OT Goal Formulation: With patient Time For Goal Achievement: 12/02/20 Potential to Achieve Goals: Good ADL Goals Pt Will Perform Grooming: with modified independence;standing Pt Will Perform Lower Body Bathing: with modified independence;sitting/lateral leans;sit to/from stand Pt Will Perform Lower Body Dressing: with modified independence;sitting/lateral leans;sit to/from stand Pt Will Transfer to Toilet: with modified independence;ambulating Additional ADL Goal #1: Pt to verbalize at least 2 energy conservation strategies to use during ADLs/mobility  OT Frequency: Min 2X/week   Barriers to D/C:            Co-evaluation  AM-PAC OT "6 Clicks" Daily Activity     Outcome Measure Help from another person eating meals?: A Little Help from another person taking care of personal grooming?: A Little Help from another person toileting, which includes using toliet, bedpan, or urinal?: A Little Help from another  person bathing (including washing, rinsing, drying)?: A Little Help from another person to put on and taking off regular upper body clothing?: A Little Help from another person to put on and taking off regular lower body clothing?: A Little 6 Click Score: 18   End of Session Equipment Utilized During Treatment: Gait belt;Rolling walker Nurse Communication: Mobility status;Other (comment) (O2)  Activity Tolerance: Patient tolerated treatment well Patient left: in chair;with call bell/phone within reach;with chair alarm set  OT Visit Diagnosis: Unsteadiness on feet (R26.81);Other abnormalities of gait and mobility (R26.89);Muscle weakness (generalized) (M62.81);Other symptoms and signs involving cognitive function                Time: 6387-5643 OT Time Calculation (min): 29 min Charges:  OT General Charges $OT Visit: 1 Visit OT Evaluation $OT Eval Moderate Complexity: 1 Mod OT Treatments $Self Care/Home Management : 8-22 mins  Malachy Chamber, OTR/L Acute Rehab Services Office: [phone]  Layla Maw 11/18/2020, 9:00 AM

## 2020-11-18 NOTE — Progress Notes (Signed)
Rerral was made on 13 Nov 2020, no facilities have yet accepted patient. CSW will make additional referrals and readout to facilities for placement.  Pt. meets criteria for inpatient treatment per Lindon Romp, NP. Referred out to the following hospitals:   Destination  Service Provider Request Status Selected Services Address Phone Fax Patient Preferred  [facility]  Pending - Request Sent N/A [address], [place] [address] [place] --  [facility]  Pending - Request Sent N/A [address] [place] [address] [phone] --  [place] [address], [place] [address] [phone] --  [place] [address] [place] [address] [phone] --  [place] [address] [name] [address] [phone] --  [place] N/A [address] [place] [address] [place] [address] [place] [address] [phone] [place] [address] [place] [address] [phone] --  [place] [address] [place] [address] [phone] --  [place] [address] [place] [address] [phone] --     Disposition CSW will continue to follow for placement.   Signed:  Durenda Hurt, MSW, [place], LCASA 11/18/2020 9:46 AM

## 2020-11-18 NOTE — TOC Progression Note (Signed)
Transition of Care [facility]) - Progression Note    Patient Details  Name: Stefanie Houston MRN: [id number] Date of Birth: 1940/10/21  Transition of Care [facility]) CM/SW Contact  Angelita Ingles, RN Phone Number: [phone]  11/18/2020, 11:30 AM  Clinical Narrative:    CM attempted to call husband to discuss [name]. No answer. Patient states that husband is aware that she should d/c today. Will await husbands return call or arrival.         Expected Discharge Plan and Services           Expected Discharge Date: 11/18/20                                     Social Determinants of Health (SDOH) Interventions    Readmission Risk Interventions No flowsheet data found.

## 2020-11-18 NOTE — TOC Transition Note (Signed)
Transition of Care [facility]) - CM/SW Discharge Note   Patient Details  Name: Stefanie Houston MRN: [id number] Date of Birth: 1941/06/10  Transition of Care [facility]) CM/SW Contact:  Angelita Ingles, RN Phone Number: [phone]  11/18/2020, 1:11 PM   Clinical Narrative:    Patient discharging home with spouse and HH . Choices offered  to patient and spouse. HH has been set up through [facility]. Patients husband refuses shower chair stating that she has a new shower with a shower seat and does not need shower chair/seat. No other needs noted at this time. TOC will sign off.     Final next level of care: [place] Barriers to Discharge: No Barriers Identified   Patient Goals and CMS Choice Patient states their goals for this hospitalization and ongoing recovery are:: Patient states she just wants to get better CMS Medicare.gov Compare Post Acute Care list provided to:: Patient Represenative (must comment) Choice offered to / list presented to : Spouse  Discharge Placement                       Discharge Plan and Services                DME Arranged: N/A (refused shower chair husband reports new shower with a seat) DME Agency: NA       HH Arranged: PT,OT [place] Agency: [place] [facility] Date [place]: 11/18/20 Time [place]: 2130 Representative spoke with at [place]: [place] [facility] Interventions     Readmission Risk Interventions No flowsheet data found.

## 2020-11-18 NOTE — Progress Notes (Signed)
Physical Therapy Treatment Patient Details Name: Stefanie Houston MRN: [id number] DOB: 1941-07-15 Today's Date: 11/18/2020    History of Present Illness 80 yo female with onset of paranoid delusions and hypotension with tachycardia was brought to hosp, now referred to PT. Noted pleural effusion, ascites, cholelithiasis, and diverticulosis.  PMHx:  atherosclerosis, DM, CAD, meningioma,    PT Comments    Pt making progress towards her goals through increased tolerance to mobility on RA today. Pt able to maintain SpO2 >/= 97% on RA throughout session. Pt ambulated x2 bouts of ~30 ft distance with a RW and min guard-A for safety as pt tends to display fatigue as distance progresses. Pt cued to improve posture and increase bil step length, but poor carryover. Will continue to follow acutely. Pt would benefit from continued skilled PT services in the [facility] PT setting to further maximize her safety and independence with functional mobility at d/c home. Pt educated on safe utilization of rollator for mobility at home and to sit and rest in rollator seat as needed to prevent excessive fatigue and falls.     Follow Up Recommendations  Home health PT;Supervision/Assistance - 24 hour;Supervision for mobility/OOB     Equipment Recommendations  None recommended by PT    Recommendations for Other Services       Precautions / Restrictions Precautions Precautions: Fall Restrictions Weight Bearing Restrictions: No    Mobility  Bed Mobility Overal bed mobility: Needs Assistance Bed Mobility: Supine to Sit     Supine to sit: Min assist     General bed mobility comments: Pt sitting up in recliner upon arrival.    Transfers Overall transfer level: Needs assistance Equipment used: Rolling walker (2 wheeled);1 person hand held assist Transfers: Sit to/from Stand Sit to Stand: Min guard         General transfer comment: min guard for safety with extra time to power up to stand and return to sit.  Cues for at least keeping 1 hand on current/target sitting surface with transition to stand and return to sit rather than bil hands on RW.  Ambulation/Gait Ambulation/Gait assistance: Min guard;Min assist Gait Distance (Feet): 30 Feet (x2 bouts) Assistive device: Rolling walker (2 wheeled);1 person hand held assist Gait Pattern/deviations: Step-through pattern;Decreased stride length;Wide base of support;Trunk flexed Gait velocity: reduced Gait velocity interpretation: <1.31 ft/sec, indicative of household ambulator General Gait Details: Pt ambulates at slow pace with decreased bil step length and kyphotic posture, displaying increased knee unsteadiness but no appreciative buckling as pt fatigues with distance. Min guard majority of time, intermittent minA for safety as distance and fatigue progresses. No overt LOB.   Stairs             Wheelchair Mobility    Modified Rankin (Stroke Patients Only)       Balance Overall balance assessment: Needs assistance Sitting-balance support: Bilateral upper extremity supported Sitting balance-Leahy Scale: Good     Standing balance support: Bilateral upper extremity supported;During functional activity Standing balance-Leahy Scale: Poor Standing balance comment: Bil UE on RW with mobility                            Cognition Arousal/Alertness: Awake/alert Behavior During Therapy: WFL for tasks assessed/performed Overall Cognitive Status: No family/caregiver present to determine baseline cognitive functioning Area of Impairment: Following commands;Safety/judgement;Awareness;Problem solving                       Following  Commands: Follows one step commands with increased time;Follows multi-step commands with increased time Safety/Judgement: Decreased awareness of deficits;Decreased awareness of safety Awareness: Emergent Problem Solving: Slow processing;Requires verbal cues General Comments: Follows directions  with increased time for processing. Requires intermittent cues for safety with mobility.      Exercises      General Comments General comments (skin integrity, edema, etc.): Pt on RA with SpO2 remaining >/= 97% throughout session, HR noted to be up to 127 at highest with gait      Pertinent Vitals/Pain Pain Assessment: Faces Faces Pain Scale: No hurt Pain Intervention(s): Monitored during session    Home Living Family/patient expects to be discharged to:: Private residence Living Arrangements: Spouse/significant other Available Help at Discharge: Family;Available 24 hours/day Type of Home: House     Home Layout: One level Home Equipment: [place] - 4 wheels;Cane - single point      Prior Function Level of Independence: Needs assistance  Gait / Transfers Assistance Needed: reports use of Rollator that may be too tall for pt for mobility (reports she is too short to sit on seat) or SPC. Sleeps in recliner or couch ADL's / Homemaking Assistance Needed: Reports able to complete ADLs, typically sponge bathes due to fatigue in standing for showering tasks. Husband assists with IADLs and transportation, pt does not drive     PT Goals (current goals can now be found in the care plan section) Acute Rehab PT Goals Patient Stated Goal: to walk and go home PT Goal Formulation: With patient Time For Goal Achievement: 12/01/20 Potential to Achieve Goals: Fair Progress towards PT goals: Progressing toward goals    Frequency    Min 3X/week      PT Plan Current plan remains appropriate    Co-evaluation              AM-PAC PT "6 Clicks" Mobility   Outcome Measure  Help needed turning from your back to your side while in a flat bed without using bedrails?: A Little Help needed moving from lying on your back to sitting on the side of a flat bed without using bedrails?: A Little Help needed moving to and from a bed to a chair (including a wheelchair)?: A Little Help needed  standing up from a chair using your arms (e.g., wheelchair or bedside chair)?: A Little Help needed to walk in hospital room?: A Little Help needed climbing 3-5 steps with a railing? : A Lot 6 Click Score: 17    End of Session Equipment Utilized During Treatment: Gait belt Activity Tolerance: Patient limited by fatigue;Patient tolerated treatment well Patient left: in chair;with call bell/phone within reach;with chair alarm set;with nursing/sitter in room Nurse Communication: Mobility status (SpO2 during session) PT Visit Diagnosis: Unsteadiness on feet (R26.81);Muscle weakness (generalized) (M62.81);Difficulty in walking, not elsewhere classified (R26.2);Other abnormalities of gait and mobility (R26.89)     Time: 3299-2426 PT Time Calculation (min) (ACUTE ONLY): 16 min  Charges:  $Gait Training: 8-22 mins                     Moishe Spice, PT, DPT Acute Rehabilitation Services  Pager: [phone] Office: [place] 11/18/2020, 9:49 AM

## 2020-11-18 NOTE — Discharge Instructions (Signed)
Acute Kidney Injury, Adult  Acute kidney injury is a sudden worsening of kidney function. The kidneys are organs that have several jobs. They filter the blood to remove waste products and extra fluid. They also maintain a healthy balance of minerals and hormones in the body, which helps control blood pressure and keep bones strong. With this condition, your kidneys do not do their jobs as well as they should. This condition ranges from mild to severe. Over time, it may develop into long-lasting (chronic) kidney disease. Early detection and treatment may prevent acute kidney injury from developing into a chronic condition. What are the causes? Common causes of this condition include:  A problem with blood flow to the kidneys. This may be caused by: ? Low blood pressure (hypotension) or shock. ? Blood loss. ? Heart and blood vessel (cardiovascular) disease. ? Severe burns. ? Liver disease.  Direct damage to the kidneys. This may be caused by: ? Certain medicines. ? A kidney infection. ? Poisoning. ? Being around or in contact with toxic substances. ? A surgical wound. ? A hard, direct hit to the kidney area.  A sudden blockage of urine flow. This may be caused by: ? Cancer. ? Kidney stones. ? An enlarged prostate in males. What increases the risk? You are more likely to develop this condition if you:  Are older than age 65.  Are female.  Are hospitalized, especially if you are in critical condition.  Have certain conditions, such as: ? Chronic kidney disease. ? Diabetes. ? Coronary artery disease and heart failure. ? Pulmonary disease. ? Chronic liver disease. What are the signs or symptoms? Symptoms of this condition may not be obvious until the condition becomes severe. Symptoms of this condition can include:  Tiredness (lethargy) or difficulty staying awake.  Nausea or vomiting.  Swelling (edema) of the face, legs, ankles, or feet.  Problems with urination, such  as: ? Pain in the abdomen, or pain along the side of your stomach (flank). ? Producing little or no urine. ? Passing urine with a weak flow.  Muscle twitches and cramps, especially in the legs.  Confusion or trouble concentrating.  Loss of appetite.  Fever. How is this diagnosed? Your health care provider can diagnose this condition based on your symptoms, medical history, and a physical exam.  You may also have other tests, such as:  Blood tests.  Urine tests.  Imaging tests.  A test in which a sample of tissue is removed from the kidneys to be examined under a microscope (kidney biopsy). How is this treated? Treatment for this condition depends on the cause and how severe the condition is. In mild cases, treatment may not be needed. The kidneys may heal on their own. In more severe cases, treatment will involve:  Treating the cause of the kidney injury. This may involve changing any medicines you are taking or adjusting your dosage.  Fluids. You may need specialized IV fluids to balance your body's needs.  Having a catheter placed to drain urine and prevent blockages.  Preventing problems from occurring. This may mean avoiding certain medicines or procedures that can cause further injury to the kidneys. In some cases, treatment may also require:  A procedure to remove toxic wastes from the body (dialysis or continuous renal replacement therapy, CRRT).  Surgery. This may be done to repair a torn kidney or to remove the blockage from the urinary system. Follow these instructions at home: Medicines  Take over-the-counter and prescription medicines only as   told by your health care provider.  Do not take any new medicines without your health care provider's approval. Many medicines can worsen your kidney damage.  Do not take any vitamin and mineral supplements without your health care provider's approval. Many nutritional supplements can worsen your kidney  damage. Lifestyle  If your health care provider prescribed changes to your diet, follow them. You may need to decrease the amount of protein you eat.  Achieve and maintain a healthy weight. If you need help with this, ask your health care provider.  Start or continue an exercise plan. Try to exercise at least 30 minutes a day, 5 days a week.  Do not use any products that contain nicotine or tobacco, such as cigarettes, e-cigarettes, and chewing tobacco. If you need help quitting, ask your health care provider.   General instructions  Keep track of your blood pressure. Report changes in your blood pressure as told by your health care provider.  Stay up to date with your vaccines. Ask your health care provider which vaccines you need.  Keep all follow-up visits as told by your health care provider. This is important.   Where to find more information  American Association of Kidney Patients: www.aakp.org  National Kidney Foundation: www.kidney.org  [employer] Kid[employer] Fund: www.akfinc.org  [employer] [facility]: ? www.lifeoptions.org ? www.kidneyschool.org Contact a health care provider if:  Your symptoms get worse.  You develop new symptoms. Get help right away if:  You develop symptoms of worsening kidney disease, which include: ? Headaches. ? Abnormally dark or light skin. ? Easy bruising. ? Frequent hiccups. ? Chest pain. ? Shortness of breath. ? End of menstruation in women. ? Seizures. ? Confusion or altered mental status. ? Abdominal or back pain. ? Itchiness.  You have a fever.  Your body is producing less urine.  You have pain or bleeding when you urinate. Summary  Acute kidney injury is a sudden worsening of kidney function.  Acute kidney injury can be caused by problems with blood flow to the kidneys, direct damage to the kidneys, and sudden blockage of urine flow.  Symptoms of this condition may not be obvious until it becomes severe.  Symptoms may include edema, lethargy, confusion, nausea or vomiting, and problems passing urine.  This condition can be diagnosed with blood tests, urine tests, and imaging tests. Sometimes a kidney biopsy is done to diagnose this condition.  Treatment for this condition often involves treating the underlying cause. It is treated with fluids, medicines, diet changes, dialysis, or surgery. This information is not intended to replace advice given to you by your health care provider. Make sure you discuss any questions you have with your health care provider. Document Revised: 07/16/2019 Document Reviewed: 07/16/2019 [employer] Patient Education  2021 [employer]  

## 2020-11-20 LAB — CULTURE, BLOOD (ROUTINE X 2)
Culture: NO GROWTH
Culture: NO GROWTH

## 2020-11-30 ENCOUNTER — Encounter: Payer: Self-pay | Admitting: Family

## 2020-11-30 ENCOUNTER — Other Ambulatory Visit: Payer: Self-pay

## 2020-11-30 ENCOUNTER — Ambulatory Visit (INDEPENDENT_AMBULATORY_CARE_PROVIDER_SITE_OTHER): Payer: Medicare Other | Admitting: Family

## 2020-11-30 VITALS — BP 120/70 | HR 85 | Temp 97.1°F | Resp 16 | Ht 62.0 in | Wt 220.0 lb

## 2020-11-30 DIAGNOSIS — K219 Gastro-esophageal reflux disease without esophagitis: Secondary | ICD-10-CM

## 2020-11-30 DIAGNOSIS — N183 Chronic kidney disease, stage 3 unspecified: Secondary | ICD-10-CM

## 2020-11-30 DIAGNOSIS — E1169 Type 2 diabetes mellitus with other specified complication: Secondary | ICD-10-CM

## 2020-11-30 DIAGNOSIS — F22 Delusional disorders: Secondary | ICD-10-CM

## 2020-11-30 DIAGNOSIS — E1165 Type 2 diabetes mellitus with hyperglycemia: Secondary | ICD-10-CM

## 2020-11-30 DIAGNOSIS — E1122 Type 2 diabetes mellitus with diabetic chronic kidney disease: Secondary | ICD-10-CM | POA: Diagnosis not present

## 2020-11-30 DIAGNOSIS — R2681 Unsteadiness on feet: Secondary | ICD-10-CM

## 2020-11-30 DIAGNOSIS — I129 Hypertensive chronic kidney disease with stage 1 through stage 4 chronic kidney disease, or unspecified chronic kidney disease: Secondary | ICD-10-CM | POA: Diagnosis not present

## 2020-11-30 DIAGNOSIS — R413 Other amnesia: Secondary | ICD-10-CM

## 2020-11-30 DIAGNOSIS — I25119 Atherosclerotic heart disease of native coronary artery with unspecified angina pectoris: Secondary | ICD-10-CM

## 2020-11-30 DIAGNOSIS — Z1159 Encounter for screening for other viral diseases: Secondary | ICD-10-CM

## 2020-11-30 DIAGNOSIS — Z780 Asymptomatic menopausal state: Secondary | ICD-10-CM

## 2020-11-30 DIAGNOSIS — R9431 Abnormal electrocardiogram [ECG] [EKG]: Secondary | ICD-10-CM

## 2020-11-30 DIAGNOSIS — E538 Deficiency of other specified B group vitamins: Secondary | ICD-10-CM

## 2020-11-30 DIAGNOSIS — E782 Mixed hyperlipidemia: Secondary | ICD-10-CM

## 2020-11-30 MED ORDER — GLUCOSE BLOOD VI STRP: 1.0000 | ORAL_STRIP | 2 refills | Status: DC | PRN

## 2020-11-30 MED ORDER — INSULIN GLARGINE 100 UNIT/ML SOLOSTAR PEN: 20.0000 [IU] | PEN_INJECTOR | Freq: Every day | SUBCUTANEOUS | 11 refills | Status: AC

## 2020-11-30 MED ORDER — VITAMIN B-12 1000 MCG PO TABS: 1000.0000 ug | ORAL_TABLET | Freq: Every day | ORAL | 5 refills | Status: DC

## 2020-11-30 MED ORDER — ACCU-CHEK FASTCLIX LANCET KIT: 1.0000 | PACK | Freq: Every day | 5 refills | Status: DC

## 2020-11-30 NOTE — Progress Notes (Signed)
Provider:   FNP-C   Rudene Anda, MD  Patient Care Team: Rudene Anda, MD as PCP - General (Internal Medicine)  Extended Emergency Contact Information Primary Emergency Contact: Liat, Mayol Mobile Phone: [phone] Relation: Spouse Secondary Emergency Contact: Joella, Saefong Mobile Phone: [phone] Relation: Other  Code Status: Full Code  Goals of care: Advanced Directive information Advanced Directives 11/30/2020  Does Patient Have a Medical Advance Directive? Yes  Type of Advance Directive Living will;Out of facility DNR (pink MOST or yellow form)  Does patient want to make changes to medical advance directive? No - Patient declined  Would patient like information on creating a medical advance directive? -     Chief Complaint  Patient presents with  . Establish Care    New Patient.    HPI:  Pt is a 80 y.o. female seen today for medical management of chronic diseases. She is here with husband.He states patient has not had a PCP for several years.she is status post hospital admission 11/12/2020 - 11/18/2020 for Paranoid delusion.Pyschiatry was consulted recommended transfer to Psychiatric bed but while she was still awaiting to transfer to Psychiatry bed she developed worsening Tachycardia with intermittent chest discomfort and shortness of breath.she was treated with Diltiazem 10 mg I.V and Amiodarone 150 mg.Cardiologist was consulted.Lab worker indicted acute Kidney injury with CR 2.45 and become hypotensive.Also had leukocytosis 14.5.she was diagnosed with UTI treated with Rocephin x 3 days. Blood cultures were negative.Her mental status improved. Had CT scan of the Abdomen which indicated cholelithiasis without cholecystitis.COVID-19 test was negative.  Her troponin's was slightly but flat.Tachycardia resolved thought from sepsis. Also had Hyperglycemia treated with Insulin.had reported allergic to insulin but verified with PCP reports no allergies to  Insulin. Seroquel at bedtime was recommended if QRS was not prolonged but EKG indicated prolonged QRS.Repeat EKG.Her condition stabilized and was discharged home.Recomenmded for follow up with Neurologist and Psychiatry.Husband thinks still has paranoia which patent dispute.        Type 2 DM -  Husband states glucometer sometimes not reading and sometimes working fine.Fasting CBG 110's and non-fastting readings in the 190's. Denies any numbness or tingling on hands or feet.  Has not had her annual eye or foot exam.Request referral to Opthalmologist Dr. Amalia Hailey with [facility] in [facility]. Currently on Lantus 20 units and glimepiride 8 mg daily.   Hypertension - No home blood pressure readings for review.on lisinopril 10 mg daily she denies any chest pain,palpitation or shortness of breath.   Memory loss - referral to Neurologist recommended from the hospital.vitamin B12 level was on the lower range normal.No behavioral issues reported.    GERD - states symptoms controlled as long as she takes Nexium 40 mg tablet daily.sometimes takes OTC Tums as needed .   Due for Bone density has not had it in several years   Overactive bladder - had not been taking her deltrol prior to hospital admission.incontinent for bladder    Past Medical History:  Diagnosis Date  . Acid reflux   . Anemia   . Diabetes mellitus without complication ([place])   . GERD (gastroesophageal reflux disease)   . History of CT scan   . History of mammogram   . History of urinary tract infection   . Hyperlipidemia   . Hypertension   . Pneumonia    Past Surgical History:  Procedure Laterality Date  . CATARACT EXTRACTION, BILATERAL  2013   Dr.Evans  . TONSILLECTOMY      Allergies  Allergen  Reactions  . Insulins Other (See Comments)    Per patient: "made me feel crazy, I don't like that feeling anymore"  UPDATE 2/26 - ER provider called PCP and they report no known evidence of insulin allergy.  . Penicillins Other  (See Comments)    Per patient, childhood reaction     Allergies as of 11/30/2020      Reactions   Insulins Other (See Comments)   Per patient: "made me feel crazy, I don't like that feeling anymore" UPDATE 2/26 - ER provider called PCP and they report no known evidence of insulin allergy.   Penicillins Other (See Comments)   Per patient, childhood reaction      Medication List       Accurate as of November 30, 2020 11:34 AM. If you have any questions, ask your nurse or doctor.        acetaminophen 325 MG tablet Commonly known as: TYLENOL Take 650 mg by mouth every 6 (six) hours as needed for mild pain or headache.   aspirin 81 MG EC tablet Take 1 tablet (81 mg total) by mouth daily. Swallow whole.   atorvastatin 20 MG tablet Commonly known as: LIPITOR Take 1 tablet (20 mg total) by mouth daily.   blood glucose meter kit and supplies Kit Dispense based on patient and insurance preference. Use up to four times daily as directed.   esomeprazole 40 MG capsule Commonly known as: NEXIUM Take 1 capsule (40 mg total) by mouth every morning.   glimepiride 4 MG tablet Commonly known as: AMARYL Take 2 tablets (8 mg total) by mouth daily with breakfast.   guaiFENesin 600 MG 12 hr tablet Commonly known as: MUCINEX Take 600 mg by mouth 2 (two) times daily.   insulin glargine 100 UNIT/ML injection Commonly known as: LANTUS Inject 0.2 mLs (20 Units total) into the skin at bedtime.   lisinopril 10 MG tablet Commonly known as: ZESTRIL Take 1 tablet (10 mg total) by mouth daily.   polyethylene glycol 17 g packet Commonly known as: MIRALAX / GLYCOLAX Take 17 g by mouth daily as needed for moderate constipation.   tolterodine 4 MG 24 hr capsule Commonly known as: DETROL LA Take 4 mg by mouth daily. What changed: Another medication with the same name was removed. Continue taking this medication, and follow the directions you see here. Changed by: Otis Peak, CMA        Review of Systems  Constitutional: Negative for appetite change, chills, fatigue and fever.  HENT: Positive for dental problem. Negative for congestion, rhinorrhea, sinus pressure, sinus pain, sore throat and trouble swallowing.   Eyes: Positive for visual disturbance. Negative for discharge, redness and itching.       Follow up with Dr.Evans at [facility]   Respiratory: Negative for cough, chest tightness, shortness of breath and wheezing.   Cardiovascular: Negative for chest pain, palpitations and leg swelling.  Gastrointestinal: Negative for abdominal distention, abdominal pain, constipation, diarrhea, nausea and vomiting.  Endocrine: Negative for cold intolerance, heat intolerance, polydipsia, polyphagia and polyuria.  Genitourinary: Negative for difficulty urinating, dysuria, flank pain, frequency and urgency.       Incontinent   Musculoskeletal: Positive for gait problem. Negative for arthralgias, back pain, joint swelling, myalgias and neck pain.  Skin: Negative for color change, pallor and rash.  Neurological: Negative for dizziness, speech difficulty, weakness, light-headedness, numbness and headaches.  Hematological: Does not bruise/bleed easily.  Psychiatric/Behavioral: Negative for agitation, behavioral problems, confusion and sleep disturbance. The  patient is not nervous/anxious.     Immunization History  Administered Date(s) Administered  . Influenza, High Dose Seasonal PF 07/07/2016, 07/25/2018  . Influenza,inj,Quad PF,6-35 Mos 06/24/2017  . Influenza,inj,quad, With Preservative 07/28/2015  . Pneumococcal Conjugate-13 04/24/2015  . Pneumococcal Polysaccharide-23 04/18/2013  . Tdap 10/19/2017   Pertinent  Health Maintenance Due  Topic Date Due  . FOOT EXAM  Never done  . OPHTHALMOLOGY EXAM  Never done  . DEXA SCAN  Never done  . INFLUENZA VACCINE  04/19/2020  . HEMOGLOBIN A1C  05/12/2021  . PNA vac Low Risk Adult  Completed   Fall Risk  11/30/2020  Falls in the  past year? 1  Number falls in past yr: 0  Injury with Fall? 0   Functional Status Survey:    Vitals:   11/30/20 1101  BP: 120/70  Pulse: 85  Resp: 16  Temp: (!) 97.1 F (36.2 C)  SpO2: 98%  Weight: 220 lb (99.8 kg)  Height: 5' 2" (1.575 m)   Body mass index is 40.24 kg/m. Physical Exam Vitals reviewed.  Constitutional:      General: She is not in acute distress.    Appearance: She is morbidly obese.  HENT:     Head: Normocephalic.     Right Ear: Tympanic membrane, ear canal and external ear normal. There is no impacted cerumen.     Left Ear: Tympanic membrane, ear canal and external ear normal. There is no impacted cerumen.     Nose: Nose normal. No congestion or rhinorrhea.     Mouth/Throat:     Mouth: Mucous membranes are moist.     Pharynx: Oropharynx is clear. No oropharyngeal exudate or posterior oropharyngeal erythema.  Eyes:     General: No scleral icterus.       Right eye: No discharge.        Left eye: No discharge.     Extraocular Movements: Extraocular movements intact.     Conjunctiva/sclera: Conjunctivae normal.     Pupils: Pupils are equal, round, and reactive to light.  Neck:     Vascular: No carotid bruit.  Cardiovascular:     Rate and Rhythm: Normal rate and regular rhythm.     Pulses: Normal pulses.     Heart sounds: Normal heart sounds. No murmur heard. No friction rub. No gallop.   Pulmonary:     Effort: Pulmonary effort is normal. No respiratory distress.     Breath sounds: Normal breath sounds. No wheezing, rhonchi or rales.  Chest:     Chest wall: No tenderness.  Abdominal:     General: Bowel sounds are normal. There is no distension.     Palpations: Abdomen is soft. There is no mass.     Tenderness: There is no abdominal tenderness. There is no right CVA tenderness, left CVA tenderness, guarding or rebound.  Musculoskeletal:        General: No swelling or tenderness.     Cervical back: Normal range of motion. No rigidity or tenderness.      Right lower leg: No edema.     Left lower leg: No edema.  Lymphadenopathy:     Cervical: No cervical adenopathy.  Skin:    General: Skin is warm and dry.     Coloration: Skin is not jaundiced or pale.     Findings: No bruising, erythema or rash.  Neurological:     Mental Status: She is alert and oriented to person, place, and time.     Cranial  Nerves: No cranial nerve deficit.     Sensory: No sensory deficit.     Motor: No weakness.     Coordination: Coordination normal.     Gait: Gait abnormal.  Psychiatric:        Mood and Affect: Mood normal.        Speech: Speech normal.        Behavior: Behavior normal.        Judgment: Judgment normal.     Comments: No paranoia noted during visit      Labs reviewed: Recent Labs    11/14/20 2156 11/15/20 0101 11/16/20 0325 11/17/20 0305  NA  --  132* 139 137  K  --  3.9 4.3 4.0  CL  --  101 107 105  CO2  --  17* 22 24  GLUCOSE  --  395* 208* 134*  BUN  --  29* 28* 18  CREATININE  --  2.21* 1.54* 1.10*  CALCIUM  --  7.7* 8.2* 8.3*  MG 1.4* 1.7  --   --    Recent Labs    07/25/20 1640 11/15/20 0101 11/16/20 0325  AST 20 37 23  ALT _0 ALKPHOS 75 54 42  BILITOT 0.8 1.1 0.6  PROT 8.1 6.7 5.9*  ALBUMIN 4.0 2.8* 2.5*   Recent Labs    11/15/20 0101 11/16/20 0325 11/17/20 0305  WBC 14.9* 10.9* 9.4  NEUTROABS 12.7* 6.4 5.8  HGB 13.2 11.7* 12.1  HCT 40.1 36.3 37.5  MCV 93.0 93.8 93.1  PLT 198 157 175   Lab Results  Component Value Date   TSH 1.138 11/15/2020   Lab Results  Component Value Date   HGBA1C 14.5 (H) 11/12/2020   Lab Results  Component Value Date   CHOL 184 11/14/2020   HDL 33 (L) 11/14/2020   LDLCALC 115 (H) 11/14/2020   TRIG 181 (H) 11/14/2020   CHOLHDL 5.6 11/14/2020    Significant Diagnostic Results in last 30 days:  CT ABDOMEN PELVIS WO CONTRAST  Result Date: 11/15/2020 CLINICAL DATA:  80 year old female with history of renal failure and sepsis. Pyelonephritis. EXAM: CT ABDOMEN  AND PELVIS WITHOUT CONTRAST TECHNIQUE: Multidetector CT imaging of the abdomen and pelvis was performed following the standard protocol without IV contrast. COMPARISON:  CT the abdomen and pelvis 07/25/2020. FINDINGS: Lower chest: Small right pleural effusion lying dependently. Atherosclerotic calcifications in the descending thoracic aorta as well as the left anterior descending, left circumflex and right coronary arteries. Calcifications of the aortic valve. Hepatobiliary: Heterogeneous areas of low attenuation are noted throughout the hepatic parenchyma, indicative of heterogeneous hepatic steatosis. Large 3.7 x 3.1 cm low-attenuation lesion in the left lobe of the liver, previously characterized as a simple cyst. No other definite suspicious hepatic lesions are noted on today's noncontrast CT examination. Amorphous high attenuation material lying dependently in the gallbladder which may reflect biliary sludge and/or innumerable tiny calcified gallstones. Gallbladder is not distended. No gallbladder wall thickening. Trace amount of pericholecystic fluid, without overt surrounding inflammatory changes. Pancreas: No definite pancreatic mass or peripancreatic fluid collections or inflammatory changes noted on today's noncontrast CT examination. Spleen: Unremarkable. Adrenals/Urinary Tract: Unenhanced appearance of the kidneys and bilateral adrenal glands is unremarkable. No hydroureteronephrosis. Urinary bladder is normal in appearance. Stomach/Bowel: Unenhanced appearance of the stomach is normal. No pathologic dilatation of small bowel or colon. Numerous colonic diverticulae are noted, without surrounding inflammatory changes to suggest an acute diverticulitis at this time. The appendix is not confidently identified and  may be surgically absent. Regardless, there are no inflammatory changes noted adjacent to the cecum to suggest the presence of an acute appendicitis at this time. Vascular/Lymphatic: Aortic  atherosclerosis. No lymphadenopathy noted in the abdomen or pelvis. Reproductive: Uterus and ovaries are unremarkable in appearance. Other: Trace volume of ascites.  No pneumoperitoneum. Musculoskeletal: No aggressive appearing lytic or blastic lesions are noted in the visualized portions of the skeleton. IMPRESSION: 1. Biliary sludge and/or tiny calcified gallstones lying dependently in the gallbladder. Gallbladder is not distended and there is no gallbladder wall thickening. There is a trace amount of pericholecystic fluid, however, there are no overt surrounding inflammatory changes. If there is clinical concern for acute cholecystitis, further evaluation with right upper quadrant ultrasound should be considered. 2. Trace volume of ascites. 3. No other acute findings are noted in the abdomen or pelvis. 4. Colonic diverticulosis without evidence of acute diverticulitis at this time. 5. Small right pleural effusion lying dependently. 6. Aortic atherosclerosis, in addition to 3 vessel coronary artery disease. Assessment for potential risk factor modification, dietary therapy or pharmacologic therapy may be warranted, if clinically indicated. 7. There are calcifications of the aortic valve. Echocardiographic correlation for evaluation of potential valvular dysfunction may be warranted if clinically indicated. 8. Additional incidental findings, as above. Electronically Signed   By: Vinnie Langton M.D.   On: 11/15/2020 14:19   DG Chest Portable 1 View  Result Date: 11/14/2020 CLINICAL DATA:  Chest pain. EXAM: PORTABLE CHEST 1 VIEW COMPARISON:  September 27, 2018. FINDINGS: Stable cardiomediastinal silhouette. No pneumothorax or pleural effusion is noted. Both lungs are clear. The visualized skeletal structures are unremarkable. IMPRESSION: No active disease. Electronically Signed   By: Marijo Conception M.D.   On: 11/14/2020 21:55   DG Abd Portable 2V  Result Date: 11/15/2020 CLINICAL DATA:  Bowel obstruction,  vomiting EXAM: PORTABLE ABDOMEN - 2 VIEW COMPARISON:  None. FINDINGS: Lungs are clear. Lung apices are partially obscured, however, no definite pneumothorax. No pleural effusion. Cardiac size within normal limits. Pulmonary vascularity is normal. Surgical clips are seen within the left axilla. Normal abdominal gas pattern. No free intraperitoneal gas. Moderate stool within the distal colon. Inferior pelvis is partially excluded. No organomegaly. Vascular calcifications noted within the pelvis. IMPRESSION: Normal abdominal gas pattern.  Moderate stool. Electronically Signed   By: Fidela Salisbury MD   On: 11/15/2020 02:43   ECHOCARDIOGRAM COMPLETE  Result Date: 11/16/2020    ECHOCARDIOGRAM REPORT   Patient Name:   JACQUEL REDDITT Date of Exam: 11/16/2020 Medical Rec #:  [id number]     Height:       62.0 in Accession #:    [id number]    Weight:       220.0 lb Date of Birth:  [birth year]     BSA:          1.991 m Patient Age:    46 years      BP:           136/68 mmHg Patient Gender: F             HR:           103 bpm. Exam Location:  Inpatient Procedure: 2D Echo, Cardiac Doppler and Color Doppler Indications:    Chest Pain R07.9  History:        Patient has no prior history of Echocardiogram examinations.                 CAD; Risk Factors:Hypertension, Diabetes, Dyslipidemia  and                 Non-Smoker.  Sonographer:    Vickie Epley RDCS Referring Phys: [id number] [facility]  Sonographer Comments: Image acquisition challenging due to patient body habitus. Refused definity. IMPRESSIONS  1. Left ventricular ejection fraction, by estimation, is 55 to 60%. The left ventricle has normal function. The left ventricle has no regional wall motion abnormalities. Left ventricular diastolic parameters are consistent with Grade I diastolic dysfunction (impaired relaxation).  2. Right ventricular systolic function is moderately reduced. The right ventricular size is moderately enlarged. There is moderately elevated pulmonary  artery systolic pressure. The estimated right ventricular systolic pressure is 45.4 mmHg.  3. Right atrial size was mildly dilated.  4. The mitral valve is normal in structure. Mild mitral valve regurgitation. No evidence of mitral stenosis.  5. Tricuspid valve regurgitation is moderate to severe.  6. The aortic valve is normal in structure. There is moderate calcification of the aortic valve. There is moderate thickening of the aortic valve. Aortic valve regurgitation is trivial. Mild to moderate aortic valve sclerosis/calcification is present, without any evidence of aortic stenosis.  7. The inferior vena cava is dilated in size with <50% respiratory variability, suggesting right atrial pressure of 15 mmHg. FINDINGS  Left Ventricle: Left ventricular ejection fraction, by estimation, is 55 to 60%. The left ventricle has normal function. The left ventricle has no regional wall motion abnormalities. The left ventricular internal cavity size was normal in size. There is  no left ventricular hypertrophy. Left ventricular diastolic parameters are consistent with Grade I diastolic dysfunction (impaired relaxation). Normal left ventricular filling pressure. Right Ventricle: The right ventricular size is moderately enlarged. No increase in right ventricular wall thickness. Right ventricular systolic function is moderately reduced. There is moderately elevated pulmonary artery systolic pressure. The tricuspid  regurgitant velocity is 3.29 m/s, and with an assumed right atrial pressure of 15 mmHg, the estimated right ventricular systolic pressure is 09.8 mmHg. Left [facility]: Left atrial size was normal in size. Right [facility]: Right atrial size was mildly dilated. Pericardium: There is no evidence of pericardial effusion. Mitral Valve: The mitral valve is normal in structure. Mild mitral valve regurgitation. No evidence of mitral valve stenosis. Tricuspid Valve: The tricuspid valve is normal in structure. Tricuspid valve  regurgitation is moderate to severe. No evidence of tricuspid stenosis. Aortic Valve: The aortic valve is normal in structure. There is moderate calcification of the aortic valve. There is moderate thickening of the aortic valve. Aortic valve regurgitation is trivial. Mild to moderate aortic valve sclerosis/calcification is present, without any evidence of aortic stenosis. Pulmonic Valve: The pulmonic valve was normal in structure. Pulmonic valve regurgitation is not visualized. No evidence of pulmonic stenosis. Aorta: The aortic root is normal in size and structure. Venous: The inferior vena cava is dilated in size with less than 50% respiratory variability, suggesting right atrial pressure of 15 mmHg. IAS/Shunts: No atrial level shunt detected by color flow Doppler.  LEFT VENTRICLE PLAX 2D LVIDd:         4.50 cm     Diastology LVIDs:         3.00 cm     LV e' medial:    5.44 cm/s LV PW:         0.90 cm     LV E/e' medial:  11.9 LV IVS:        0.90 cm     LV e' lateral:   7.07 cm/s  LVOT diam:     2.20 cm     LV E/e' lateral: 9.1 LV SV:         38 LV SV Index:   19 LVOT Area:     3.80 cm  LV Volumes (MOD) LV vol d, MOD A4C: 90.0 ml LV vol s, MOD A4C: 47.3 ml LV SV MOD A4C:     90.0 ml RIGHT VENTRICLE RV S prime:     9.57 cm/s TAPSE (M-mode): 1.6 cm LEFT ATRIUM             Index       RIGHT ATRIUM           Index LA diam:        3.00 cm 1.51 cm/m  RA Area:     13.10 cm LA Vol (A2C):   21.4 ml 10.75 ml/m RA Volume:   31.80 ml  15.97 ml/m LA Vol (A4C):   21.8 ml 10.95 ml/m LA Biplane Vol: 22.6 ml 11.35 ml/m  AORTIC VALVE LVOT Vmax:   67.20 cm/s LVOT Vmean:  50.900 cm/s LVOT VTI:    0.100 m  AORTA Ao Root diam: 3.00 cm MITRAL VALVE               TRICUSPID VALVE MV Area (PHT): 8.25 cm    TR Peak grad:   43.3 mmHg MV Decel Time: 92 msec     TR Vmax:        329.00 cm/s MV E velocity: 64.50 cm/s MV A velocity: 89.70 cm/s  SHUNTS MV E/A ratio:  0.72        Systemic VTI:  0.10 m                            Systemic  Diam: 2.20 cm Ena Dawley MD Electronically signed by Ena Dawley MD Signature Date/Time: 11/16/2020/11:58:16 AM    Final     Assessment/Plan 1. Uncontrolled type 2 diabetes mellitus with hyperglycemia, without long-term current use of insulin (HCC) Lab Results  Component Value Date   HGBA1C 14.5 (H) 11/12/2020  Reports CBG fasting readings in the 110's and non-fasting 190's since hospital discharge. - dietary modification advised. - continue on Lantus and glimepiride  - on ACE inhibitor for renal protection - continue on ASA and Statin  - glucose blood test strip; 1 each by Other route as needed. Use as instructed  Dispense: 100 each; Refill: 2 - Lancets Misc. (ACCU-CHEK FASTCLIX LANCET) KIT; 1 Device by Does not apply route daily.  Dispense: 1 kit; Refill: 5 - Ambulatory referral to Podiatry for diabetic foot exam  - Ambulatory referral to Ophthalmology for diabetic annual eye exam  - insulin glargine (LANTUS) 100 UNIT/ML Solostar Pen; Inject 20 Units into the skin at bedtime.  Dispense: 15 mL; Refill: 11 - CBC with Differential/Platelet; Future - CMP with eGFR([facility]); Future - TSH; Future - Hemoglobin A1c; Future - Lipid panel; Future  2. Mixed diabetic hyperlipidemia associated with type 2 diabetes mellitus (HCC) LDL on chart not at goal - continue on atorvastatin  - dietary and lifestyle modification advised  - Lipid panel; Future  3. 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 Coronary artery disease involving native coronary artery of native heart with angina pectoris (HCC) Chest pain  free.Neg troponin during hospitalization.  - continue on ASA and Statin  - B/p well controlled  - CBC with Differential/Platelet; Future  5. GERD without esophagitis Symptoms controlled on esomeprazole  - H/H stable  - CBC with Differential/Platelet; Future  6. Postmenopausal estrogen deficiency No recent fall or fracture  Will order bone density today.made aware imaging center will call for appointment. - DG Bone Density; Future  7. 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   9. Memory loss Scored 28/30 on MMSE  No behavioral issues. - Ambulatory referral to Neurology  10. Vitamin B12 deficiency vitamin B12 level was on the lower range normal. - vitamin B-12 (CYANOCOBALAMIN) 1000 MCG tablet; Take 1 tablet (1,000 mcg total) by mouth daily.  Dispense: 30 tablet; Refill: 5  11. Unsteady gait Unsteady gait  - continue with PT/OT - fall and safety precaution advised.    12. Abnormal EKG - EKG 12-Lead indicates SR with nonspecific QRS widening HR 83 b/min similar to recent EKG results from the ED.Will refer to cardiologist for further evaluation  - Ambulatory referral to Cardiology  Family/ staff Communication: Reviewed plan of care with patient and Husband verbalized understanding.   Labs/tests ordered:  - CBC with Differential/Platelet; Future - CMP with eGFR([facility]); Future - TSH; Future  Lipid panel; Future - Hemoglobin A1c; Future  Next Appointment : 4 months for medical management of chronic issues   Time spent with patient 60 minutes >50% time spent counseling; reviewing medical record; tests; labs; and developing future plan of care   Sandrea Hughs, NP

## 2020-11-30 NOTE — Patient Instructions (Signed)
-   Referral ordered today specialist office will call you for appointment  - Please get lab worker drawn at [facility] laboratory nearby you in [place] as requested.

## 2020-12-02 ENCOUNTER — Other Ambulatory Visit: Payer: Self-pay

## 2020-12-02 DIAGNOSIS — Z87440 Personal history of urinary (tract) infections: Secondary | ICD-10-CM | POA: Insufficient documentation

## 2020-12-02 DIAGNOSIS — E119 Type 2 diabetes mellitus without complications: Secondary | ICD-10-CM | POA: Insufficient documentation

## 2020-12-02 DIAGNOSIS — K219 Gastro-esophageal reflux disease without esophagitis: Secondary | ICD-10-CM | POA: Insufficient documentation

## 2020-12-02 DIAGNOSIS — D649 Anemia, unspecified: Secondary | ICD-10-CM | POA: Insufficient documentation

## 2020-12-02 DIAGNOSIS — Z9289 Personal history of other medical treatment: Secondary | ICD-10-CM | POA: Insufficient documentation

## 2020-12-02 DIAGNOSIS — J189 Pneumonia, unspecified organism: Secondary | ICD-10-CM | POA: Insufficient documentation

## 2020-12-02 DIAGNOSIS — I10 Essential (primary) hypertension: Secondary | ICD-10-CM | POA: Insufficient documentation

## 2020-12-02 DIAGNOSIS — E785 Hyperlipidemia, unspecified: Secondary | ICD-10-CM | POA: Insufficient documentation

## 2020-12-04 ENCOUNTER — Encounter: Payer: Self-pay | Admitting: Cardiology

## 2020-12-04 ENCOUNTER — Other Ambulatory Visit: Payer: Self-pay

## 2020-12-04 ENCOUNTER — Ambulatory Visit (INDEPENDENT_AMBULATORY_CARE_PROVIDER_SITE_OTHER): Payer: Medicare Other | Admitting: Cardiology

## 2020-12-04 VITALS — BP 132/66 | HR 86 | Ht 62.0 in | Wt 186.1 lb

## 2020-12-04 DIAGNOSIS — R9431 Abnormal electrocardiogram [ECG] [EKG]: Secondary | ICD-10-CM

## 2020-12-04 DIAGNOSIS — I10 Essential (primary) hypertension: Secondary | ICD-10-CM | POA: Diagnosis not present

## 2020-12-04 DIAGNOSIS — E1169 Type 2 diabetes mellitus with other specified complication: Secondary | ICD-10-CM

## 2020-12-04 DIAGNOSIS — I071 Rheumatic tricuspid insufficiency: Secondary | ICD-10-CM

## 2020-12-04 DIAGNOSIS — E782 Mixed hyperlipidemia: Secondary | ICD-10-CM

## 2020-12-04 DIAGNOSIS — R011 Cardiac murmur, unspecified: Secondary | ICD-10-CM

## 2020-12-04 HISTORY — DX: Cardiac murmur, unspecified: R01.1

## 2020-12-04 HISTORY — DX: Rheumatic tricuspid insufficiency: I07.1

## 2020-12-04 HISTORY — DX: Abnormal electrocardiogram (ECG) (EKG): R94.31

## 2020-12-04 NOTE — Patient Instructions (Signed)

## 2020-12-04 NOTE — Progress Notes (Signed)
Cardiology Office Note:    Date:  12/04/2020   ID:  Stefanie Houston, DOB 1940/12/06, MRN [id number]  PCP:  Sandrea Hughs, NP  Cardiologist:  Jenean Lindau, MD   Referring MD: Sandrea Hughs, NP    ASSESSMENT:    1. Essential hypertension   2. Mixed hyperlipidemia   3. Mixed diabetic hyperlipidemia associated with type 2 diabetes mellitus (HCC)   4. Cardiac murmur   5. Abnormal EKG   6. Severe tricuspid regurgitation    PLAN:    In order of problems listed above:  1. Primary prevention stressed with patient.  Importance of compliance with diet medication stressed and she vocalized understanding. 2. Abnormal EKG: I reviewed echocardiogram and EKG report with her at length.  I discussed with him.  She is completely asymptomatic and he denies any chest pain orthopnea or PND.  I asked these questions multiple number of times. 3. Severe tricuspid regurgitation: Asymptomatic at this time.  Medical management.  She is extremely frail and here hemoglobin A1c was greater than 12 and she is just begun a program of insulin and taking good care of herself.  We will keep an eye on it and monitor it closely.  Currently she is asymptomatic. 4. Mixed dyslipidemia: On statin therapy and managed by primary care. 5. Uncontrolled diabetes mellitus: As mentioned above.  She has not seen a physician for years and her hemoglobin A1c was greater than 12 I cautioned her and her husband against this.  She has been told that she could see an endocrinologist if it is difficult to get her blood sugar under control and she understands.  She and her husband had multiple questions which were answered to their satisfaction.   6. Patient will be seen in follow-up appointment in 6 months or earlier if the patient has any concerns    Medication Adjustments/Labs and Tests Ordered: Current medicines are reviewed at length with the patient today.  Concerns regarding medicines are outlined above.  No orders of the  defined types were placed in this encounter.  No orders of the defined types were placed in this encounter.    History of Present Illness:    Stefanie Houston is a 80 y.o. female who is being seen today for the evaluation of abnormal EKG and echocardiogram at the request of Ngetich, [name] C, NP.  Patient is a pleasant 80 year old female.  She is accompanied by her husband.  The patient mentions to me that she was admitted to the hospital recently for markedly elevated blood sugar.  She has not seen a doctor for many years.  No chest pain orthopnea or PND.  Echocardiogram revealed left ventricular systolic function being preserved and elevated right-sided pressures with severe tricuspid regurgitation.  At the time of my evaluation, the patient is alert awake oriented and in no distress.  Patient denies any history of myocardial infarction or essential hypertension.  She has dyslipidemia.  At the time of my evaluation, the patient is alert awake oriented and in no distress.  She leads a sedentary lifestyle and is brought in here with a wheelchair.  She appears frail.  Past Medical History:  Diagnosis Date  . Acid reflux   . Acute renal failure superimposed on stage 3a chronic kidney disease ([place]) 11/14/2020  . Anemia   . Benign essential tremor 09/26/2017  . Chest pain 11/14/2020  . Coronary artery disease involving native coronary artery of native heart 10/26/2015   Formatting of this note might  be different from the original. H/O stenting 2013 [place]. Formatting of this note might be different from the original. Overview:  H/O stenting 2013 [place].  . Dehydration with hyponatremia 11/14/2020  . Diabetes mellitus without complication ([place])   . Essential hypertension 11/14/2020  . GERD (gastroesophageal reflux disease)   . GERD without esophagitis 10/26/2015  . History of CT scan   . History of mammogram   . History of urinary tract infection   . Hyperlipidemia   . Hypertension   . Hypertension associated  with stage 3 chronic kidney disease due to type 2 diabetes mellitus ([place]) 07/19/2017  . Hypomagnesemia 11/14/2020  . Lactic acidosis 11/14/2020  . LBBB (left bundle branch block) 06/23/2017  . Meningioma ([place]) 10/24/2017   Formatting of this note might be different from the original. Frontal MRI 09/2017 - following with neuro  . Mixed diabetic hyperlipidemia associated with type 2 diabetes mellitus ([place]) 11/14/2020  . 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 Pneumonia   . Sepsis due to gram-negative UTI ([place]) 11/14/2020  . Uncontrolled type 2 diabetes mellitus with hyperglycemia, without long-term current use of insulin ([place]) 11/14/2020  . Wide-complex tachycardia ([place]) 11/14/2020    Past Surgical History:  Procedure Laterality Date  . CATARACT EXTRACTION, BILATERAL  2013   Dr.Evans  . TONSILLECTOMY      Current Medications: Current Meds  Medication Sig  . acetaminophen (TYLENOL) 325 MG tablet Take 650 mg by mouth every 6 (six) hours as needed for mild pain or headache.  Marland Kitchen aspirin EC 81 MG EC tablet Take 1 tablet (81 mg total) by mouth daily. Swallow whole.  Marland Kitchen atorvastatin (LIPITOR) 20 MG tablet Take 1 tablet (20 mg total) by mouth daily.  Marland Kitchen esomeprazole (NEXIUM) 40 MG capsule Take 1 capsule (40 mg total) by mouth every morning.  Marland Kitchen glimepiride (AMARYL) 4 MG tablet Take 2 tablets (8 mg total) by mouth daily with breakfast.  . guaiFENesin (MUCINEX) 600 MG 12 hr tablet Take 600 mg by mouth 2 (two) times daily.  . insulin glargine (LANTUS) 100 UNIT/ML Solostar Pen Inject 20 Units into the skin at bedtime.  Marland Kitchen lisinopril (ZESTRIL) 10 MG tablet Take 1 tablet (10 mg total) by mouth daily.  . polyethylene glycol (MIRALAX / GLYCOLAX) 17 g packet Take 17 g by mouth daily as needed for moderate constipation.     Allergies:   Insulins and Penicillins   Social History   Socioeconomic History  . Marital status: Married    Spouse name: Not on file  . Number of children: Not on file  . Years of  education: Not on file  . Highest education level: Not on file  Occupational History  . Not on file  Tobacco Use  . Smoking status: Never Smoker  . Smokeless tobacco: Never Used  Substance and Sexual Activity  . Alcohol use: Not Currently  . Drug use: Never  . Sexual activity: Not on file  Other Topics Concern  . Not on file  Social History Narrative   Tobacco use, amount per day now: Never    Past tobacco use, amount per day: Never   How many years did you use tobacco: Never   Alcohol use (drinks per week): None is past 30 years   Diet:   Do you drink/eat things with caffeine: Limited    Marital status: Married  What year were you married? 1965   Do you live in a house, apartment, assisted living, condo, trailer, etc.? House.   Is it one or more stories? 1 story.   How many persons live in your home? 2   Do you have pets in your home?( please list) 2 cats, 1 inside/1inside&outside   Highest Level of education completed? High School   Current or past profession: Retired [employer]   Do you exercise?   No                               Type and how often? N/A   Do you have a living will? Yes   Do you have a DNR form?      No                             If not, do you want to discuss one?   Do you have signed POA/HPOA forms? No                       If so, please bring to you appointment      Do you have any difficulty bathing or dressing yourself? Yes   Do you have any difficulty preparing food or eating? Yes   Do you have any difficulty managing your medications? Yes   Do you have any difficulty managing your finances? No   Do you have any difficulty affording your medications? No   Social Determinants of Radio broadcast assistant Strain: Not on file  Food Insecurity: Not on file  Transportation Needs: Not on file  Physical Activity: Not on file  Stress: Not on file  Social Connections: Not on file     Family History: The patient's  family history includes Alzheimer's disease in her sister; Cancer in her father and mother. There is no history of Psychiatric Illness.  ROS:   Please see the history of present illness.    All other systems reviewed and are negative.  EKGs/Labs/Other Studies Reviewed:    The following studies were reviewed today:  IMPRESSIONS    1. Left ventricular ejection fraction, by estimation, is 55 to 60%. The  left ventricle has normal function. The left ventricle has no regional  wall motion abnormalities. Left ventricular diastolic parameters are  consistent with Grade I diastolic  dysfunction (impaired relaxation).  2. Right ventricular systolic function is moderately reduced. The right  ventricular size is moderately enlarged. There is moderately elevated  pulmonary artery systolic pressure. The estimated right ventricular  systolic pressure is 71.0 mmHg.  3. Right atrial size was mildly dilated.  4. The mitral valve is normal in structure. Mild mitral valve  regurgitation. No evidence of mitral stenosis.  5. Tricuspid valve regurgitation is moderate to severe.  6. The aortic valve is normal in structure. There is moderate  calcification of the aortic valve. There is moderate thickening of the  aortic valve. Aortic valve regurgitation is trivial. Mild to moderate  aortic valve sclerosis/calcification is present,  without any evidence of aortic stenosis.  7. The inferior vena cava is dilated in size with <50% respiratory  variability, suggesting right atrial pressure of 15 mmHg.    Recent Labs: 11/15/2020: B Natriuretic Peptide 73.3; Magnesium 1.7; TSH 1.138 11/16/2020: ALT 23 11/17/2020: BUN 18; Creatinine, Ser 1.10; Hemoglobin 12.1; Platelets 175; Potassium 4.0; Sodium  137  Recent Lipid Panel    Component Value Date/Time   CHOL 184 11/14/2020 0327   TRIG 181 (H) 11/14/2020 0327   HDL 33 (L) 11/14/2020 0327   CHOLHDL 5.6 11/14/2020 0327   VLDL 36 11/14/2020 0327   LDLCALC  115 (H) 11/14/2020 0327    Physical Exam:    VS:  BP 132/66   Pulse 86   Ht 5\' 2"  (1.575 m)   Wt 186 lb 1.9 oz (84.4 kg)   SpO2 97%   BMI 34.04 kg/m     Wt Readings from Last 3 Encounters:  12/04/20 186 lb 1.9 oz (84.4 kg)  11/30/20 220 lb (99.8 kg)  11/12/20 220 lb 0.3 oz (99.8 kg)     GEN: Patient is in no acute distress HEENT: Normal NECK: No JVD; No carotid bruits LYMPHATICS: No lymphadenopathy CARDIAC: S1 S2 regular, 2/6 systolic murmur at the apex. RESPIRATORY:  Clear to auscultation without rales, wheezing or rhonchi  ABDOMEN: Soft, non-tender, non-distended MUSCULOSKELETAL:  No edema; No deformity  SKIN: Warm and dry NEUROLOGIC:  Alert and oriented x 3 PSYCHIATRIC:  Normal affect    Signed, Jenean Lindau, MD  12/04/2020 4:01 PM    [facility]

## 2020-12-08 ENCOUNTER — Other Ambulatory Visit: Payer: Self-pay

## 2020-12-08 ENCOUNTER — Ambulatory Visit (HOSPITAL_BASED_OUTPATIENT_CLINIC_OR_DEPARTMENT_OTHER)
Admission: RE | Admit: 2020-12-08 | Discharge: 2020-12-08 | Disposition: A | Discharge status: Home / Self Care | Payer: Medicare Other | Source: Ambulatory Visit | Attending: Family | Admitting: Family

## 2020-12-08 DIAGNOSIS — Z780 Asymptomatic menopausal state: Secondary | ICD-10-CM | POA: Insufficient documentation

## 2020-12-14 ENCOUNTER — Other Ambulatory Visit: Payer: Self-pay

## 2020-12-14 MED ORDER — LISINOPRIL 10 MG PO TABS: 10.0000 mg | ORAL_TABLET | Freq: Every day | ORAL | 1 refills | Status: DC

## 2020-12-14 MED ORDER — ASPIRIN 81 MG PO TBEC: 81.0000 mg | DELAYED_RELEASE_TABLET | Freq: Every day | ORAL | 1 refills | Status: AC

## 2020-12-14 MED ORDER — GLIMEPIRIDE 4 MG PO TABS: 8.0000 mg | ORAL_TABLET | Freq: Every day | ORAL | 1 refills | Status: DC

## 2020-12-16 ENCOUNTER — Other Ambulatory Visit: Payer: Self-pay | Admitting: *Deleted

## 2020-12-16 ENCOUNTER — Telehealth: Payer: Self-pay

## 2020-12-16 DIAGNOSIS — R2681 Unsteadiness on feet: Secondary | ICD-10-CM

## 2020-12-16 DIAGNOSIS — R413 Other amnesia: Secondary | ICD-10-CM

## 2020-12-16 MED ORDER — GLIMEPIRIDE 4 MG PO TABS: 8.0000 mg | ORAL_TABLET | Freq: Every day | ORAL | 1 refills | Status: DC

## 2020-12-16 NOTE — Telephone Encounter (Signed)
Spoke with Lonn Georgia from [facility] care OT, she wants to get orders for 1 week for 4 weeks She was also asked by caregivers to inquire about wether you are able to write order for new ramp for patient her's is too high. Message routed to Mel Almond, NP

## 2020-12-16 NOTE — Telephone Encounter (Signed)
[facility] requested refill.  

## 2020-12-16 NOTE — Telephone Encounter (Signed)
Please give verbal orders for [facility] as requested.will write script DME for Ramp then POA will need to send or state where script need to be faxed to and see if it's covered by her Insurance.

## 2020-12-21 NOTE — Telephone Encounter (Signed)
Forwarded to clinical Intake.

## 2020-12-23 ENCOUNTER — Telehealth: Payer: Self-pay

## 2020-12-23 NOTE — Telephone Encounter (Signed)
Schedule for office appointment to evaluate first then will send fro counseling if indicated.

## 2020-12-23 NOTE — Telephone Encounter (Signed)
[facility]la with [facility] OT.Patient having mental health issues per spouse. Patient believes that cult is keeping her awake at night. Spouse would like to know if she could get a counseling or psychiatric referral. Prefers a Marketing executive or psychiatrist Please advise. Message routed to Marlowe Sax, NP  [phone] Lonn Georgia.

## 2020-12-24 NOTE — Telephone Encounter (Signed)
Called and spoke with [facility]. She said she would inform husband to call and schedule appointment.

## 2021-01-07 ENCOUNTER — Other Ambulatory Visit: Payer: Self-pay

## 2021-01-07 MED ORDER — VITAMIN D (CHOLECALCIFEROL) 50 MCG (2000 UT) PO CAPS: 1.0000 | ORAL_CAPSULE | Freq: Every day | ORAL | 0 refills | Status: DC

## 2021-01-07 MED ORDER — CALCIUM 600 MG PO TABS: 600.0000 mg | ORAL_TABLET | Freq: Two times a day (BID) | ORAL | 0 refills | Status: DC

## 2021-01-13 ENCOUNTER — Telehealth: Payer: Self-pay | Admitting: *Deleted

## 2021-01-13 NOTE — Telephone Encounter (Signed)
Stefanie Houston with [facility] called requesting to extend PT 1x9 Verbal orders given.

## 2021-01-21 ENCOUNTER — Telehealth: Payer: Self-pay | Admitting: *Deleted

## 2021-01-21 NOTE — Telephone Encounter (Signed)
Beth with [facility] called and stated that she saw patient today. Stated that patient was telling her that she had diarrhea last week and hasn't had a BM since.   Nurse stated that patient has Miralax prn but does not take it at all. Stated that she strongly encouraged patient to take the Miralax for the constipation.   Tried calling patient. No Answer.   Please Advise. (sent to Janett Billow due to Webb Silversmith out of office.)

## 2021-01-21 NOTE — Telephone Encounter (Signed)
Called and spoke with husband. He stated that he has started today giving the Miralax to patient in her OJ.

## 2021-01-21 NOTE — Telephone Encounter (Signed)
I agree sounds like the patient needs to be taking her miralax regularly

## 2021-03-01 ENCOUNTER — Other Ambulatory Visit: Payer: Self-pay | Admitting: *Deleted

## 2021-03-01 MED ORDER — INSULIN PEN NEEDLE 32G X 4 MM MISC: 0 refills | Status: DC

## 2021-03-01 MED ORDER — INSULIN PEN NEEDLE 32G X 4 MM MISC
3 refills | Status: DC
Start: 2021-03-01 — End: 2021-04-05

## 2021-03-01 MED ORDER — TOLTERODINE TARTRATE ER 4 MG PO CP24: 4.0000 mg | ORAL_CAPSULE | Freq: Every day | ORAL | 1 refills | Status: AC

## 2021-03-01 NOTE — Telephone Encounter (Signed)
Patient husband called and stated that patient is needing a refill on her Detrol LA 4mg  once daily.  Medication is not in current medication list but takes the medication.   Is it ok to add back and refill. Requesting Rx to be sent to [facility].   Please Advise.

## 2021-03-01 NOTE — Telephone Encounter (Signed)
Medication list updated and Rx sent to pharmacy  

## 2021-03-01 NOTE — Telephone Encounter (Signed)
May restart Dietrol per patient's request.

## 2021-04-01 ENCOUNTER — Ambulatory Visit (INDEPENDENT_AMBULATORY_CARE_PROVIDER_SITE_OTHER): Payer: Medicare Other | Admitting: Family

## 2021-04-01 ENCOUNTER — Encounter: Payer: Self-pay | Admitting: Family

## 2021-04-01 ENCOUNTER — Other Ambulatory Visit: Payer: Self-pay

## 2021-04-01 VITALS — BP 120/68 | HR 94 | Temp 98.0°F | Resp 16 | Ht 62.0 in | Wt 196.0 lb

## 2021-04-01 DIAGNOSIS — E1169 Type 2 diabetes mellitus with other specified complication: Secondary | ICD-10-CM | POA: Diagnosis not present

## 2021-04-01 DIAGNOSIS — N183 Chronic kidney disease, stage 3 unspecified: Secondary | ICD-10-CM | POA: Diagnosis not present

## 2021-04-01 DIAGNOSIS — E1165 Type 2 diabetes mellitus with hyperglycemia: Secondary | ICD-10-CM | POA: Diagnosis not present

## 2021-04-01 DIAGNOSIS — I129 Hypertensive chronic kidney disease with stage 1 through stage 4 chronic kidney disease, or unspecified chronic kidney disease: Secondary | ICD-10-CM

## 2021-04-01 DIAGNOSIS — Z6835 Body mass index (BMI) 35.0-35.9, adult: Secondary | ICD-10-CM

## 2021-04-01 DIAGNOSIS — I25119 Atherosclerotic heart disease of native coronary artery with unspecified angina pectoris: Secondary | ICD-10-CM

## 2021-04-01 DIAGNOSIS — E782 Mixed hyperlipidemia: Secondary | ICD-10-CM

## 2021-04-01 DIAGNOSIS — B372 Candidiasis of skin and nail: Secondary | ICD-10-CM | POA: Diagnosis not present

## 2021-04-01 DIAGNOSIS — E1122 Type 2 diabetes mellitus with diabetic chronic kidney disease: Secondary | ICD-10-CM | POA: Diagnosis not present

## 2021-04-01 MED ORDER — NYSTATIN 100000 UNIT/GM EX CREA: 1.0000 "application " | TOPICAL_CREAM | Freq: Two times a day (BID) | CUTANEOUS | 0 refills | Status: AC

## 2021-04-01 NOTE — Progress Notes (Signed)
Provider: Marlowe Sax FNP-C   Hall Birchard, Nelda Bucks, NP  Patient Care Team: Rhaelyn Giron, Nelda Bucks, NP as PCP - General (Family Medicine)  Extended Emergency Contact Information Primary Emergency Contact: Pen Mar, [place] Phone: [phone] Relation: Spouse Secondary Emergency Contact: Brittain, [place] Mobile Phone: [phone] Relation: Other  Code Status:Full Code  Goals of care: Advanced Directive information Advanced Directives 04/01/2021  Does Patient Have a Medical Advance Directive? No  Type of Advance Directive -  Does patient want to make changes to medical advance directive? No - Patient declined  Would patient like information on creating a medical advance directive? -     Chief Complaint  Patient presents with   Medical Management of Chronic Issues    4 month follow up   Health Maintenance    Discuss the need for Foot exam, Eye exam, Urine microalbumin, and Hepatitis C Screening.    Immunizations    Discuss the need for Shingrix vaccine, and Covid Booster.    HPI:  Pt is a 80 y.o. female seen today 4 months follow up for medical management of chronic diseases.She is here with her Husband.States has been doing well.Has some redness under her breast that she would like it checked. She walks with a cane.has had no recent fall.  Appetite is good but has trouble chewing due to missing teeth so does not eat fresh vegetables.Husband usually cooks them really soft for her.   Husband recalls CBG in the 78's - 130's did not bring log in today.she denies any signs of hypoglycemia.currently on glimepiride and Lantus. Follows with podiatrist to trim toenails.States had nails trimmed one week ago.No records for review.   Spouse states he received a call from the eye doctor's office but before he could answer they left a voicemail.When he call back he could not get through just gets a voicemail but was not able to get appointment.Has not had any call back.Usually seen   Dr.Evans.he would like another referral placed so that they can call him back.   Urine micro albumin due.will provide specimen today.   Does have orders in place for fasting blood work but not fasting today.she will get labs done in [place] lab on [address]  in [facility]   Also due for shingles and COVID-19 booster vaccine.  Past Medical History:  Diagnosis Date   Acid reflux    Acute renal failure superimposed on stage 3a chronic kidney disease ([place]) 11/14/2020   Anemia    Benign essential tremor 09/26/2017   Chest pain 11/14/2020   Coronary artery disease involving native coronary artery of native heart 10/26/2015   Formatting of this note might be different from the original. H/O stenting 2013 [place]. Formatting of this note might be different from the original. Overview:  H/O stenting 2013 [place].   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 Frontal MRI 09/2017 - following with neuro  Mixed diabetic hyperlipidemia associated with type 2 diabetes mellitus ([place]) 11/14/2020   Paranoid delusion ([place]) 11/14/2020   Pneumonia    Sepsis due to gram-negative UTI ([place]) 11/14/2020   Uncontrolled type 2 diabetes mellitus with hyperglycemia, without long-term current use of insulin ([place]) 11/14/2020   Wide-complex tachycardia ([place]) 11/14/2020   Past Surgical History:  Procedure Laterality Date   CATARACT EXTRACTION, BILATERAL  2013    Dr.Evans   TONSILLECTOMY      Allergies  Allergen Reactions   Insulins Other (See Comments)    Per patient: "made me feel crazy, I don't like that feeling anymore"  UPDATE 2/26 - ER provider called PCP and they report no known evidence of insulin allergy.   Penicillins Other (See Comments)    Per patient, childhood reaction     Allergies as of 04/01/2021       Reactions   Insulins Other (See Comments)   Per patient: "made me feel crazy, I don't like that feeling anymore" UPDATE 2/26 - ER provider called PCP and they report no known evidence of insulin allergy.   Penicillins Other (See Comments)   Per patient, childhood reaction        Medication List        Accurate as of April 01, 2021 10:37 AM. If you have any questions, ask your nurse or doctor.          STOP taking these medications    calcium carbonate 600 MG tablet Commonly known as: OS-CAL Stopped by: Sandrea Hughs, NP   esomeprazole 40 MG capsule Commonly known as: NEXIUM Stopped by: Sandrea Hughs, NP   vitamin D3 50 MCG (2000 UT) Caps Stopped by: Sandrea Hughs, NP       TAKE these medications    acetaminophen 325 MG tablet Commonly known as: TYLENOL Take 650 mg by mouth every 6 (six) hours as needed for mild pain or headache.   aspirin EC 81 MG tablet Take 81 mg by mouth daily. Swallow whole.   atorvastatin 20 MG tablet Commonly known as: LIPITOR Take 1 tablet (20 mg total) by mouth daily.   cyanocobalamin 2000 MCG tablet Take 2,000 mcg by mouth daily.   glimepiride 4 MG tablet Commonly known as: AMARYL Take 2 tablets (8 mg total) by mouth daily with breakfast.   guaiFENesin 600 MG 12 hr tablet Commonly known as: MUCINEX Take 600 mg by mouth 2 (two) times daily.   insulin glargine 100 UNIT/ML Solostar Pen Commonly known as: LANTUS Inject 20 Units into the skin at bedtime.   Insulin Pen Needle 32G X 4 MM Misc Use to inject Insulin once daily. Dx: E11.65   lisinopril 10 MG  tablet Commonly known as: ZESTRIL Take 1 tablet (10 mg total) by mouth daily.   polyethylene glycol 17 g packet Commonly known as: MIRALAX / GLYCOLAX Take 17 g by mouth daily as needed for moderate constipation.   tolterodine 4 MG 24 hr capsule Commonly known as: DETROL LA Take 1 capsule (4 mg total) by mouth daily.        Review of Systems  Constitutional:  Negative for appetite change, chills, fatigue, fever and unexpected weight change.  HENT:  Positive for dental problem. Negative for congestion, ear discharge, ear pain, facial swelling, hearing loss, nosebleeds, postnasal drip, rhinorrhea, sinus pressure, sinus pain, sneezing, sore throat, tinnitus and trouble swallowing.   Eyes:  Negative for pain, discharge, redness, itching and visual disturbance.  Respiratory:  Negative for cough, chest  tightness, shortness of breath and wheezing.   Cardiovascular:  Negative for chest pain, palpitations and leg swelling.  Gastrointestinal:  Negative for abdominal distention, abdominal pain, blood in stool, constipation, diarrhea, nausea and vomiting.  Endocrine: Negative for cold intolerance, heat intolerance, polydipsia, polyphagia and polyuria.  Genitourinary:  Negative for difficulty urinating, dysuria and flank pain.       Over active bladder detrol effective   Musculoskeletal:  Positive for gait problem. Negative for arthralgias, back pain, joint swelling, myalgias, neck pain and neck stiffness.  Skin:  Negative for color change, pallor and wound.       Breast fold rash   Neurological:  Negative for dizziness, syncope, speech difficulty, weakness, light-headedness, numbness and headaches.  Hematological:  Does not bruise/bleed easily.  Psychiatric/Behavioral:  Negative for agitation, behavioral problems, confusion, hallucinations, self-injury, sleep disturbance and suicidal ideas. The patient is not nervous/anxious.    Immunization History  Administered Date(s) Administered    Influenza, High Dose Seasonal PF 07/07/2016, 07/25/2018   Influenza,inj,Quad PF,6-35 Mos 06/24/2017   Influenza,inj,quad, With Preservative 07/28/2015   Moderna Sars-Covid-2 Vaccination 12/26/2019, 01/23/2020   Pneumococcal Conjugate-13 04/24/2015   Pneumococcal Polysaccharide-23 04/18/2013   Tdap 10/19/2017   Pertinent  Health Maintenance Due  Topic Date Due   FOOT EXAM  Never done   OPHTHALMOLOGY EXAM  Never done   URINE MICROALBUMIN  Never done   INFLUENZA VACCINE  04/19/2021   HEMOGLOBIN A1C  05/12/2021   DEXA SCAN  Completed   PNA vac Low Risk Adult  Completed   Fall Risk  04/01/2021 11/30/2020  Falls in the past year? 0 1  Number falls in past yr: 0 0  Injury with Fall? 0 0  Risk for fall due to : No Fall Risks -  Follow up Falls evaluation completed -   Functional Status Survey:    Vitals:   04/01/21 1015  BP: 120/68  Pulse: 94  Resp: 16  Temp: 98 F (36.7 C)  SpO2: 98%  Weight: 196 lb (88.9 kg)  Height: 5\' 2"  (1.575 m)   Body mass index is 35.85 kg/m. Physical Exam Vitals reviewed.  Constitutional:      General: She is not in acute distress.    Appearance: Normal appearance. She is obese. She is not ill-appearing or diaphoretic.  HENT:     Head: Normocephalic.     Right Ear: Tympanic membrane, ear canal and external ear normal. There is no impacted cerumen.     Left Ear: Tympanic membrane, ear canal and external ear normal. There is no impacted cerumen.     Nose: Nose normal. No congestion or rhinorrhea.     Mouth/Throat:     Mouth: Mucous membranes are moist.     Pharynx: Oropharynx is clear. No oropharyngeal exudate or posterior oropharyngeal erythema.  Eyes:     General: No scleral icterus.       Right eye: No discharge.        Left eye: No discharge.     Extraocular Movements: Extraocular movements intact.     Conjunctiva/sclera: Conjunctivae normal.     Pupils: Pupils are equal, round, and reactive to light.  Neck:     Vascular: No carotid  bruit.  Cardiovascular:     Rate and Rhythm: Normal rate and regular rhythm.     Pulses: Normal pulses.     Heart sounds: Normal heart sounds. No murmur heard.   No friction rub. No gallop.  Pulmonary:     Effort: Pulmonary effort  is normal. No respiratory distress.     Breath sounds: Normal breath sounds. No wheezing, rhonchi or rales.  Chest:     Chest wall: No tenderness.  Abdominal:     General: Bowel sounds are normal. There is no distension.     Palpations: Abdomen is soft. There is no mass.     Tenderness: There is no abdominal tenderness. There is no right CVA tenderness, left CVA tenderness, guarding or rebound.  Musculoskeletal:        General: No swelling or tenderness. Normal range of motion.     Cervical back: Normal range of motion. No rigidity or tenderness.     Right lower leg: No edema.     Left lower leg: No edema.     Comments: Unsteady gait ambulates with a walker   Lymphadenopathy:     Cervical: No cervical adenopathy.  Skin:    General: Skin is warm and dry.     Coloration: Skin is not pale.     Findings: No bruising, lesion or rash.     Comments: Bilateral breast fold beefy redness noted.No odor or drainage.   Neurological:     Mental Status: She is alert and oriented to person, place, and time.     Cranial Nerves: No cranial nerve deficit.     Motor: No weakness.     Coordination: Coordination normal.     Gait: Gait abnormal.  Psychiatric:        Mood and Affect: Mood normal.        Speech: Speech normal.        Behavior: Behavior normal.        Thought Content: Thought content normal.        Judgment: Judgment normal.    Labs reviewed: Recent Labs    11/14/20 2156 11/15/20 0101 11/16/20 0325 11/17/20 0305  NA  --  132* 139 137  K  --  3.9 4.3 4.0  CL  --  101 107 105  CO2  --  17* 22 24  GLUCOSE  --  395* 208* 134*  BUN  --  29* 28* 18  CREATININE  --  2.21* 1.54* 1.10*  CALCIUM  --  7.7* 8.2* 8.3*  MG 1.4* 1.7  --   --    Recent  Labs    07/25/20 1640 11/15/20 0101 11/16/20 0325  AST 20 37 23  ALT 20 29 23   ALKPHOS 75 54 42  BILITOT 0.8 1.1 0.6  PROT 8.1 6.7 5.9*  ALBUMIN 4.0 2.8* 2.5*   Recent Labs    11/15/20 0101 11/16/20 0325 11/17/20 0305  WBC 14.9* 10.9* 9.4  NEUTROABS 12.7* 6.4 5.8  HGB 13.2 11.7* 12.1  HCT 40.1 36.3 37.5  MCV 93.0 93.8 93.1  PLT 198 157 175   Lab Results  Component Value Date   TSH 1.138 11/15/2020   Lab Results  Component Value Date   HGBA1C 14.5 (H) 11/12/2020   Lab Results  Component Value Date   CHOL 184 11/14/2020   HDL 33 (L) 11/14/2020   LDLCALC 115 (H) 11/14/2020   TRIG 181 (H) 11/14/2020   CHOLHDL 5.6 11/14/2020    Significant Diagnostic Results in last 30 days:  No results found.  Assessment/Plan  1. 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 - continue on ASA and Atorvastatin  2. Mixed diabetic hyperlipidemia associated with type 2 diabetes mellitus (HCC) Previous LDL not at  goal. Has fasting lab due. - continue on Atorvastatin   3. Uncontrolled type 2 diabetes mellitus with hyperglycemia, without long-term current use of insulin (HCC) Lab Results  Component Value Date   HGBA1C 14.5 (H) 11/12/2020  Reports CBG in the 70's - 130's  Has Hgb A1C pending orders.  Foot exam up to date no Neuropathy.  - Ambulatory referral to Ophthalmology - Microalbumin / creatinine urine ratio  4. Candidiasis, intertrigo Bilateral breast fold beefy redness  - continue to keep skin folds. Advised to apply Nystatin cream as below - nystatin cream (MYCOSTATIN); Apply 1 application topically 2 (two) times daily.  Dispense: 30 g; Refill: 0  5. Body mass index (BMI) of 35.0-35.9 in adult BMI 35.85  Continue dietary modification and exercise as tolerated.   Family/ staff Communication: Reviewed plan of care with patient and Husband.  Labs/tests ordered: Has lab orders in place.would like to  have labs drawn at [place] on [address] in [facility]aware just walk in for labs.    Next Appointment : 6 months for medical management of chronic issues.Fasting Labs in tomorrow or in 1 week.    Sandrea Hughs, NP

## 2021-04-02 LAB — MICROALBUMIN / CREATININE URINE RATIO
Creatinine, Urine: 75 mg/dL (ref 20–275)
Microalb Creat Ratio: 29 mcg/mg creat (ref <30)
Microalb, Ur: 2.2 mg/dL

## 2021-04-05 ENCOUNTER — Other Ambulatory Visit: Payer: Self-pay

## 2021-04-06 ENCOUNTER — Encounter: Payer: Self-pay | Admitting: Cardiology

## 2021-04-06 ENCOUNTER — Other Ambulatory Visit: Payer: Self-pay

## 2021-04-06 ENCOUNTER — Ambulatory Visit (INDEPENDENT_AMBULATORY_CARE_PROVIDER_SITE_OTHER): Payer: Medicare Other | Admitting: Cardiology

## 2021-04-06 VITALS — BP 120/64 | HR 98 | Ht 62.0 in | Wt 192.0 lb

## 2021-04-06 DIAGNOSIS — E782 Mixed hyperlipidemia: Secondary | ICD-10-CM | POA: Diagnosis not present

## 2021-04-06 DIAGNOSIS — I2510 Atherosclerotic heart disease of native coronary artery without angina pectoris: Secondary | ICD-10-CM | POA: Diagnosis not present

## 2021-04-06 DIAGNOSIS — I10 Essential (primary) hypertension: Secondary | ICD-10-CM | POA: Diagnosis not present

## 2021-04-06 DIAGNOSIS — E1165 Type 2 diabetes mellitus with hyperglycemia: Secondary | ICD-10-CM

## 2021-04-06 DIAGNOSIS — I071 Rheumatic tricuspid insufficiency: Secondary | ICD-10-CM

## 2021-04-06 NOTE — Patient Instructions (Signed)

## 2021-04-06 NOTE — Progress Notes (Signed)
Cardiology Office Note:    Date:  04/06/2021   ID:  Stefanie Houston, DOB 1941/06/30, MRN [id number]  PCP:  Sandrea Hughs, NP  Cardiologist:  Jenean Lindau, MD   Referring MD: Sandrea Hughs, NP    ASSESSMENT:    1. Essential hypertension   2. Coronary artery disease involving native coronary artery of native heart without angina pectoris   3. Mixed hyperlipidemia   4. Severe tricuspid regurgitation   5. Uncontrolled type 2 diabetes mellitus with hyperglycemia, without long-term current use of insulin (HCC)    PLAN:    In order of problems listed above:  Secondary prevention stressed with the patient.  Importance of compliance with diet medication stressed and she vocalized understanding. Essential hypertension: Blood pressure stable and diet was emphasized. Mixed dyslipidemia and diabetes mellitus: Lipids were reviewed.  These are followed by primary care.  Patient has markedly elevated hemoglobin A1c greater than 14 and I discussed the ramifications of this to the patient.  She will try to do better and again this is followed by primary care. Severe tricuspid regurgitation: Medical management at this time.  Blood pressure is optimized.  Patient denies any symptoms.  She is very frail and comes in a wheelchair.  Medical management would be the way to proceed. Patient will be seen in follow-up appointment in 6 months or earlier if the patient has any concerns    Medication Adjustments/Labs and Tests Ordered: Current medicines are reviewed at length with the patient today.  Concerns regarding medicines are outlined above.  No orders of the defined types were placed in this encounter.  No orders of the defined types were placed in this encounter.    No chief complaint on file.    History of Present Illness:    Stefanie Houston is a 80 y.o. female.  Patient has past medical history of coronary artery disease, essential hypertension, mixed dyslipidemia, uncontrolled diabetes  mellitus and severe tricuspid regurgitation.  She is brought in in a wheelchair.  She denies any chest pain orthopnea or PND.  At the time of my evaluation, the patient is alert awake oriented and in no distress.  Past Medical History:  Diagnosis Date   Abnormal EKG 12/04/2020   Acid reflux    Acute renal failure superimposed on stage 3a chronic kidney disease ([place]) 11/14/2020   Anemia    Benign essential tremor 09/26/2017   Cardiac murmur 12/04/2020   Chest pain 11/14/2020   Coronary artery disease involving native coronary artery of native heart 10/26/2015   Formatting of this note might be different from the original. H/O stenting 2013 [place]. Formatting of this note might be different from the original. Overview:  H/O stenting 2013 [place].   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   aspirin EC 81 MG tablet Take 81 mg by mouth daily. Swallow whole.   atorvastatin (LIPITOR) 20 MG tablet Take 20 mg by mouth daily.   cyanocobalamin 2000 MCG tablet Take 2,000 mcg by mouth daily.   glimepiride (AMARYL) 4 MG tablet Take 8 mg by mouth daily with breakfast.   guaiFENesin (MUCINEX) 600 MG 12 hr tablet Take 600 mg by mouth daily.   insulin glargine (LANTUS) 100 UNIT/ML Solostar Pen Inject 20 Units into the skin at bedtime.   lisinopril (ZESTRIL) 10 MG tablet Take 10 mg by mouth daily.   nystatin cream (MYCOSTATIN) Apply 1 application topically 2 (two) times daily.   polyethylene glycol (MIRALAX / GLYCOLAX) 17 g packet Take 17 g by mouth daily as needed for moderate constipation.   tolterodine (DETROL LA) 4 MG 24 hr capsule Take 1 capsule (4 mg total) by mouth daily.     Allergies:   Insulins and Penicillins   Social History   Socioeconomic History   Marital status: Married    Spouse name: Not on file   Number of children: Not on file   Years of education: Not on file   Highest education level: Not on file  Occupational History   Not on file  Tobacco Use   Smoking status: Never   Smokeless tobacco: Never  Substance and Sexual Activity   Alcohol use: Not Currently   Drug use: Never   Sexual activity: Not on file  Other Topics Concern   Not on file  Social History Narrative   Tobacco use, amount per day now: Never    Past tobacco use, amount per day: Never   How many years did you use tobacco: Never   Alcohol use (drinks per week): None is past 30 years   Diet:   Do you drink/eat things with caffeine: Limited    Marital status: Married                                 What year were you married? 1965   Do you live in a house, apartment, assisted living, condo, trailer, etc.?  House.   Is it one or more stories? 1 story.   How many persons live in your home? 2   Do you have pets in your home?( please list) 2 cats, 1 inside/1inside&outside   Highest Level of education completed? High School   Current or past profession: Retired [employer]   Do you exercise?   No                               Type and how often? N/A   Do you have a living will? Yes   Do you have a DNR form?  No                             If not, do you want to discuss one?   Do you have signed POA/HPOA forms? No                       If so, please bring to you appointment      Do you have any difficulty bathing or dressing yourself? Yes   Do you have any difficulty preparing food or eating? Yes   Do you have any difficulty managing your medications? Yes   Do you have any difficulty managing your finances? No   Do you have any difficulty affording your medications? No   Social Determinants of Radio broadcast assistant Strain: Not on file  Food Insecurity: Not on file  Transportation Needs: Not on file  Physical Activity: Not on file  Stress: Not on file  Social Connections: Not on file     Family History: The patient's family history includes Alzheimer's disease in her sister; Cancer in her father and mother. There is no history of Psychiatric Illness.  ROS:   Please see the history of present illness.    All other systems reviewed and are negative.  EKGs/Labs/Other Studies Reviewed:    The following studies were reviewed today: I discussed my findings with the patient at length.   Recent Labs: 11/15/2020: B Natriuretic Peptide 73.3; Magnesium 1.7; TSH 1.138 11/16/2020: ALT 23 11/17/2020: BUN 18; Creatinine, Ser 1.10; Hemoglobin 12.1; Platelets 175; Potassium 4.0; Sodium 137  Recent Lipid Panel    Component Value Date/Time   CHOL 184 11/14/2020 0327   TRIG 181 (H) 11/14/2020 0327   HDL 33 (L) 11/14/2020 0327   CHOLHDL 5.6 11/14/2020 0327   VLDL 36 11/14/2020 0327    LDLCALC 115 (H) 11/14/2020 0327    Physical Exam:    VS:  BP 120/64   Pulse 98   Ht 5\' 2"  (1.575 m)   Wt 192 lb 0.6 oz (87.1 kg)   SpO2 96%   BMI 35.12 kg/m     Wt Readings from Last 3 Encounters:  04/06/21 192 lb 0.6 oz (87.1 kg)  04/01/21 196 lb (88.9 kg)  12/04/20 186 lb 1.9 oz (84.4 kg)     GEN: Patient is in no acute distress HEENT: Normal NECK: No JVD; No carotid bruits LYMPHATICS: No lymphadenopathy CARDIAC: Hear sounds regular, 2/6 systolic murmur at the apex. RESPIRATORY:  Clear to auscultation without rales, wheezing or rhonchi  ABDOMEN: Soft, non-tender, non-distended MUSCULOSKELETAL:  No edema; No deformity  SKIN: Warm and dry NEUROLOGIC:  Alert and oriented x 3 PSYCHIATRIC:  Normal affect   Signed, Jenean Lindau, MD  04/06/2021 2:33 PM    [place]

## 2021-04-12 DIAGNOSIS — R413 Other amnesia: Secondary | ICD-10-CM

## 2021-04-12 HISTORY — DX: Other amnesia: R41.3

## 2021-06-14 ENCOUNTER — Other Ambulatory Visit: Payer: Self-pay | Admitting: Family

## 2021-06-17 ENCOUNTER — Other Ambulatory Visit: Payer: Self-pay | Admitting: *Deleted

## 2021-06-17 MED ORDER — GLIMEPIRIDE 4 MG PO TABS: ORAL_TABLET | ORAL | 1 refills | Status: AC

## 2021-06-17 MED ORDER — LISINOPRIL 10 MG PO TABS: 10.0000 mg | ORAL_TABLET | Freq: Every day | ORAL | 1 refills | Status: AC

## 2021-06-17 NOTE — Telephone Encounter (Signed)
Patient husband requested refills.  

## 2021-07-14 ENCOUNTER — Other Ambulatory Visit: Payer: Self-pay | Admitting: Family

## 2021-08-09 ENCOUNTER — Encounter: Payer: Self-pay | Admitting: Orthopedic Surgery

## 2021-08-09 ENCOUNTER — Encounter: Payer: Medicare Other | Admitting: Orthopedic Surgery

## 2021-08-09 DIAGNOSIS — I214 Non-ST elevation (NSTEMI) myocardial infarction: Secondary | ICD-10-CM

## 2021-08-09 HISTORY — DX: Non-ST elevation (NSTEMI) myocardial infarction: I21.4

## 2021-08-11 ENCOUNTER — Telehealth: Payer: Self-pay | Admitting: *Deleted

## 2021-08-11 NOTE — Telephone Encounter (Signed)
Patient husband, Herbie [place] called and left message on Clinical intake stated that he just wanted to let [name] know that patient is in ICU at [facility] with Covid.   FYI  Tried calling husband to get update and LMOM for him to return call.

## 2021-08-21 DIAGNOSIS — R279 Unspecified lack of coordination: Secondary | ICD-10-CM | POA: Diagnosis not present

## 2021-08-21 DIAGNOSIS — Z743 Need for continuous supervision: Secondary | ICD-10-CM | POA: Diagnosis not present

## 2021-08-21 DIAGNOSIS — R531 Weakness: Secondary | ICD-10-CM | POA: Diagnosis not present

## 2021-08-21 DIAGNOSIS — R1111 Vomiting without nausea: Secondary | ICD-10-CM | POA: Diagnosis not present

## 2021-08-30 ENCOUNTER — Telehealth: Payer: Self-pay | Admitting: Family

## 2021-08-30 NOTE — Telephone Encounter (Signed)
Spoke with patient's Husband Mr.Darko concerning recent call while [place] was [place] states currently admitted at [facility] for rehab for generalized weakness post Hospital admission at [facility]. States has not spoken with the provider at the facility but gets reports through Nurse in the facility.Planning to meet face to face with provider at the facility.Not sure at the moment whether patient can be discharged from the rehab due to her condition.Mr. Dewing will up date [facility] office when patient is discharge from rehab.I apologies for misunderstanding while I was out of the office. No notification was send to [facility] when patient was discharge from [facility].

## 2021-08-31 NOTE — Telephone Encounter (Signed)
I added Marlowe Sax, NP back as the PCP and printed message off for Nuala Alpha, Engineer, building services.

## 2021-09-02 DIAGNOSIS — Z23 Encounter for immunization: Secondary | ICD-10-CM | POA: Diagnosis not present

## 2021-09-03 NOTE — Telephone Encounter (Signed)
Patient husband, Mikki Santee called and stated that he does want [place] to continue as patient's PCP. Stated that patient is still in [facility] and unsure when she will be discharged.   I explained to him that once patient is Discharged from facility she would follow up with our practice. He stated that didn't make sense and it defeated the purpose of having a PCP. I explained to him that the Facility Provider's see patient while in Rehab. And then when released she would follow up in office.

## 2021-09-03 NOTE — Telephone Encounter (Signed)
Stefanie Houston, husband called and left message on Clinical intake stating that they Do want to continue care with [facility]. Stated that they want [name] as patient's PCP.   I tried calling husband and LMOM to return call.

## 2021-09-08 DIAGNOSIS — R630 Anorexia: Secondary | ICD-10-CM | POA: Diagnosis not present

## 2021-09-08 DIAGNOSIS — I429 Cardiomyopathy, unspecified: Secondary | ICD-10-CM | POA: Diagnosis not present

## 2021-09-08 DIAGNOSIS — R627 Adult failure to thrive: Secondary | ICD-10-CM | POA: Diagnosis not present

## 2021-09-08 DIAGNOSIS — N179 Acute kidney failure, unspecified: Secondary | ICD-10-CM | POA: Diagnosis not present

## 2021-09-08 DIAGNOSIS — I214 Non-ST elevation (NSTEMI) myocardial infarction: Secondary | ICD-10-CM | POA: Diagnosis not present

## 2021-09-08 DIAGNOSIS — I2510 Atherosclerotic heart disease of native coronary artery without angina pectoris: Secondary | ICD-10-CM | POA: Diagnosis not present

## 2021-09-08 DIAGNOSIS — I10 Essential (primary) hypertension: Secondary | ICD-10-CM | POA: Diagnosis not present

## 2021-09-08 DIAGNOSIS — E119 Type 2 diabetes mellitus without complications: Secondary | ICD-10-CM | POA: Diagnosis not present

## 2021-09-15 DIAGNOSIS — I429 Cardiomyopathy, unspecified: Secondary | ICD-10-CM | POA: Diagnosis not present

## 2021-09-15 DIAGNOSIS — E119 Type 2 diabetes mellitus without complications: Secondary | ICD-10-CM | POA: Diagnosis not present

## 2021-09-15 DIAGNOSIS — R627 Adult failure to thrive: Secondary | ICD-10-CM | POA: Diagnosis not present

## 2021-09-15 DIAGNOSIS — I2510 Atherosclerotic heart disease of native coronary artery without angina pectoris: Secondary | ICD-10-CM | POA: Diagnosis not present

## 2021-09-15 DIAGNOSIS — I10 Essential (primary) hypertension: Secondary | ICD-10-CM | POA: Diagnosis not present

## 2021-09-19 DIAGNOSIS — E119 Type 2 diabetes mellitus without complications: Secondary | ICD-10-CM | POA: Diagnosis not present

## 2021-09-19 DIAGNOSIS — R262 Difficulty in walking, not elsewhere classified: Secondary | ICD-10-CM | POA: Diagnosis not present

## 2021-09-19 DIAGNOSIS — R5381 Other malaise: Secondary | ICD-10-CM | POA: Diagnosis not present

## 2021-09-19 DIAGNOSIS — I130 Hypertensive heart and chronic kidney disease with heart failure and stage 1 through stage 4 chronic kidney disease, or unspecified chronic kidney disease: Secondary | ICD-10-CM | POA: Diagnosis not present

## 2021-09-19 DIAGNOSIS — I10 Essential (primary) hypertension: Secondary | ICD-10-CM | POA: Diagnosis not present

## 2021-09-19 DIAGNOSIS — R000 Tachycardia, unspecified: Secondary | ICD-10-CM | POA: Diagnosis not present

## 2021-09-19 DIAGNOSIS — R627 Adult failure to thrive: Secondary | ICD-10-CM | POA: Diagnosis not present

## 2021-09-19 DIAGNOSIS — R197 Diarrhea, unspecified: Secondary | ICD-10-CM | POA: Diagnosis not present

## 2021-09-19 DIAGNOSIS — R2681 Unsteadiness on feet: Secondary | ICD-10-CM | POA: Diagnosis not present

## 2021-09-19 DIAGNOSIS — I214 Non-ST elevation (NSTEMI) myocardial infarction: Secondary | ICD-10-CM | POA: Diagnosis not present

## 2021-09-19 DIAGNOSIS — I2510 Atherosclerotic heart disease of native coronary artery without angina pectoris: Secondary | ICD-10-CM | POA: Diagnosis not present

## 2021-09-19 DIAGNOSIS — I509 Heart failure, unspecified: Secondary | ICD-10-CM | POA: Diagnosis not present

## 2021-09-19 DIAGNOSIS — Z8616 Personal history of COVID-19: Secondary | ICD-10-CM | POA: Diagnosis not present

## 2021-09-19 DIAGNOSIS — R630 Anorexia: Secondary | ICD-10-CM | POA: Diagnosis not present

## 2021-09-19 DIAGNOSIS — I429 Cardiomyopathy, unspecified: Secondary | ICD-10-CM | POA: Diagnosis not present

## 2021-09-19 DIAGNOSIS — I447 Left bundle-branch block, unspecified: Secondary | ICD-10-CM | POA: Diagnosis not present

## 2021-09-19 DIAGNOSIS — I959 Hypotension, unspecified: Secondary | ICD-10-CM | POA: Diagnosis not present

## 2021-09-19 DIAGNOSIS — M6281 Muscle weakness (generalized): Secondary | ICD-10-CM | POA: Diagnosis not present

## 2021-09-20 DIAGNOSIS — I429 Cardiomyopathy, unspecified: Secondary | ICD-10-CM | POA: Diagnosis not present

## 2021-09-20 DIAGNOSIS — R630 Anorexia: Secondary | ICD-10-CM | POA: Diagnosis not present

## 2021-09-20 DIAGNOSIS — R5381 Other malaise: Secondary | ICD-10-CM | POA: Diagnosis not present

## 2021-09-20 DIAGNOSIS — E119 Type 2 diabetes mellitus without complications: Secondary | ICD-10-CM | POA: Diagnosis not present

## 2021-09-23 ENCOUNTER — Other Ambulatory Visit: Payer: Self-pay

## 2021-09-28 DIAGNOSIS — I429 Cardiomyopathy, unspecified: Secondary | ICD-10-CM | POA: Diagnosis not present

## 2021-09-28 DIAGNOSIS — R627 Adult failure to thrive: Secondary | ICD-10-CM | POA: Diagnosis not present

## 2021-09-28 DIAGNOSIS — R000 Tachycardia, unspecified: Secondary | ICD-10-CM | POA: Diagnosis not present

## 2021-09-30 ENCOUNTER — Ambulatory Visit: Payer: Medicare Other | Admitting: Cardiology

## 2021-09-30 NOTE — Progress Notes (Deleted)
This encounter was created in error - please disregard.

## 2021-10-01 ENCOUNTER — Ambulatory Visit: Payer: Medicare Other | Admitting: Cardiology

## 2021-10-04 DIAGNOSIS — I214 Non-ST elevation (NSTEMI) myocardial infarction: Secondary | ICD-10-CM | POA: Diagnosis not present

## 2021-10-04 DIAGNOSIS — I429 Cardiomyopathy, unspecified: Secondary | ICD-10-CM | POA: Diagnosis not present

## 2021-10-04 DIAGNOSIS — R000 Tachycardia, unspecified: Secondary | ICD-10-CM | POA: Diagnosis not present

## 2021-10-04 DIAGNOSIS — I10 Essential (primary) hypertension: Secondary | ICD-10-CM | POA: Diagnosis not present

## 2021-10-13 DIAGNOSIS — I10 Essential (primary) hypertension: Secondary | ICD-10-CM | POA: Diagnosis not present

## 2021-10-13 DIAGNOSIS — R5381 Other malaise: Secondary | ICD-10-CM | POA: Diagnosis not present

## 2021-10-13 DIAGNOSIS — R627 Adult failure to thrive: Secondary | ICD-10-CM | POA: Diagnosis not present

## 2021-10-13 DIAGNOSIS — I429 Cardiomyopathy, unspecified: Secondary | ICD-10-CM | POA: Diagnosis not present

## 2021-10-13 DIAGNOSIS — I2510 Atherosclerotic heart disease of native coronary artery without angina pectoris: Secondary | ICD-10-CM | POA: Diagnosis not present

## 2021-10-13 DIAGNOSIS — E119 Type 2 diabetes mellitus without complications: Secondary | ICD-10-CM | POA: Diagnosis not present

## 2021-10-14 ENCOUNTER — Ambulatory Visit: Payer: Medicare Other | Admitting: Family

## 2021-10-14 NOTE — Progress Notes (Signed)
This encounter was created in error - please disregard.

## 2021-10-18 DIAGNOSIS — E119 Type 2 diabetes mellitus without complications: Secondary | ICD-10-CM | POA: Diagnosis not present

## 2021-10-18 DIAGNOSIS — R000 Tachycardia, unspecified: Secondary | ICD-10-CM | POA: Diagnosis not present

## 2021-10-18 DIAGNOSIS — I2510 Atherosclerotic heart disease of native coronary artery without angina pectoris: Secondary | ICD-10-CM | POA: Diagnosis not present

## 2021-10-18 DIAGNOSIS — I429 Cardiomyopathy, unspecified: Secondary | ICD-10-CM | POA: Diagnosis not present

## 2021-10-18 DIAGNOSIS — I10 Essential (primary) hypertension: Secondary | ICD-10-CM | POA: Diagnosis not present

## 2021-10-21 DIAGNOSIS — R197 Diarrhea, unspecified: Secondary | ICD-10-CM | POA: Diagnosis not present

## 2021-11-10 DIAGNOSIS — I10 Essential (primary) hypertension: Secondary | ICD-10-CM | POA: Diagnosis not present

## 2021-11-10 DIAGNOSIS — I2510 Atherosclerotic heart disease of native coronary artery without angina pectoris: Secondary | ICD-10-CM | POA: Diagnosis not present

## 2021-11-10 DIAGNOSIS — E1165 Type 2 diabetes mellitus with hyperglycemia: Secondary | ICD-10-CM | POA: Diagnosis not present

## 2021-11-10 DIAGNOSIS — I429 Cardiomyopathy, unspecified: Secondary | ICD-10-CM | POA: Diagnosis not present

## 2021-11-15 DIAGNOSIS — J189 Pneumonia, unspecified organism: Secondary | ICD-10-CM | POA: Diagnosis not present

## 2021-11-15 DIAGNOSIS — R062 Wheezing: Secondary | ICD-10-CM | POA: Diagnosis not present

## 2021-11-16 DIAGNOSIS — I509 Heart failure, unspecified: Secondary | ICD-10-CM | POA: Diagnosis not present

## 2021-11-16 DIAGNOSIS — I10 Essential (primary) hypertension: Secondary | ICD-10-CM | POA: Diagnosis not present

## 2021-11-16 DIAGNOSIS — I429 Cardiomyopathy, unspecified: Secondary | ICD-10-CM | POA: Diagnosis not present

## 2021-11-16 DIAGNOSIS — R627 Adult failure to thrive: Secondary | ICD-10-CM | POA: Diagnosis not present

## 2021-11-16 DIAGNOSIS — E119 Type 2 diabetes mellitus without complications: Secondary | ICD-10-CM | POA: Diagnosis not present

## 2021-11-20 DIAGNOSIS — R110 Nausea: Secondary | ICD-10-CM | POA: Diagnosis not present

## 2021-11-23 DIAGNOSIS — J189 Pneumonia, unspecified organism: Secondary | ICD-10-CM | POA: Diagnosis not present

## 2021-11-24 DIAGNOSIS — I429 Cardiomyopathy, unspecified: Secondary | ICD-10-CM | POA: Diagnosis not present

## 2021-11-24 DIAGNOSIS — R1084 Generalized abdominal pain: Secondary | ICD-10-CM | POA: Diagnosis not present

## 2021-11-24 DIAGNOSIS — N390 Urinary tract infection, site not specified: Secondary | ICD-10-CM | POA: Diagnosis not present

## 2021-11-24 DIAGNOSIS — R5381 Other malaise: Secondary | ICD-10-CM | POA: Diagnosis not present

## 2021-11-24 DIAGNOSIS — J189 Pneumonia, unspecified organism: Secondary | ICD-10-CM | POA: Diagnosis not present

## 2021-11-24 DIAGNOSIS — R112 Nausea with vomiting, unspecified: Secondary | ICD-10-CM | POA: Diagnosis not present

## 2021-11-25 DIAGNOSIS — R112 Nausea with vomiting, unspecified: Secondary | ICD-10-CM | POA: Diagnosis not present

## 2021-11-26 DIAGNOSIS — R112 Nausea with vomiting, unspecified: Secondary | ICD-10-CM | POA: Diagnosis not present

## 2021-11-28 DIAGNOSIS — I2510 Atherosclerotic heart disease of native coronary artery without angina pectoris: Secondary | ICD-10-CM | POA: Diagnosis not present

## 2021-11-28 DIAGNOSIS — I10 Essential (primary) hypertension: Secondary | ICD-10-CM | POA: Diagnosis not present

## 2021-11-28 DIAGNOSIS — I429 Cardiomyopathy, unspecified: Secondary | ICD-10-CM | POA: Diagnosis not present

## 2021-11-28 DIAGNOSIS — R110 Nausea: Secondary | ICD-10-CM | POA: Diagnosis not present

## 2021-11-28 DIAGNOSIS — E119 Type 2 diabetes mellitus without complications: Secondary | ICD-10-CM | POA: Diagnosis not present

## 2021-11-29 DIAGNOSIS — Z0189 Encounter for other specified special examinations: Secondary | ICD-10-CM | POA: Diagnosis not present

## 2021-11-30 DIAGNOSIS — E785 Hyperlipidemia, unspecified: Secondary | ICD-10-CM | POA: Diagnosis not present

## 2021-11-30 DIAGNOSIS — I214 Non-ST elevation (NSTEMI) myocardial infarction: Secondary | ICD-10-CM | POA: Diagnosis not present

## 2021-11-30 DIAGNOSIS — E119 Type 2 diabetes mellitus without complications: Secondary | ICD-10-CM | POA: Diagnosis not present

## 2021-11-30 DIAGNOSIS — I10 Essential (primary) hypertension: Secondary | ICD-10-CM | POA: Diagnosis not present

## 2021-11-30 DIAGNOSIS — I2510 Atherosclerotic heart disease of native coronary artery without angina pectoris: Secondary | ICD-10-CM | POA: Diagnosis not present

## 2021-11-30 DIAGNOSIS — E1165 Type 2 diabetes mellitus with hyperglycemia: Secondary | ICD-10-CM | POA: Diagnosis not present

## 2021-12-01 DIAGNOSIS — I2510 Atherosclerotic heart disease of native coronary artery without angina pectoris: Secondary | ICD-10-CM | POA: Diagnosis not present

## 2021-12-08 DIAGNOSIS — I10 Essential (primary) hypertension: Secondary | ICD-10-CM | POA: Diagnosis not present

## 2021-12-08 DIAGNOSIS — I429 Cardiomyopathy, unspecified: Secondary | ICD-10-CM | POA: Diagnosis not present

## 2021-12-08 DIAGNOSIS — E1165 Type 2 diabetes mellitus with hyperglycemia: Secondary | ICD-10-CM | POA: Diagnosis not present

## 2021-12-08 DIAGNOSIS — R5381 Other malaise: Secondary | ICD-10-CM | POA: Diagnosis not present

## 2021-12-08 DIAGNOSIS — I2510 Atherosclerotic heart disease of native coronary artery without angina pectoris: Secondary | ICD-10-CM | POA: Diagnosis not present

## 2021-12-14 DIAGNOSIS — R627 Adult failure to thrive: Secondary | ICD-10-CM | POA: Diagnosis not present

## 2021-12-14 DIAGNOSIS — I429 Cardiomyopathy, unspecified: Secondary | ICD-10-CM | POA: Diagnosis not present

## 2021-12-14 DIAGNOSIS — I2510 Atherosclerotic heart disease of native coronary artery without angina pectoris: Secondary | ICD-10-CM | POA: Diagnosis not present

## 2021-12-14 DIAGNOSIS — E119 Type 2 diabetes mellitus without complications: Secondary | ICD-10-CM | POA: Diagnosis not present

## 2021-12-14 DIAGNOSIS — I10 Essential (primary) hypertension: Secondary | ICD-10-CM | POA: Diagnosis not present

## 2021-12-24 DIAGNOSIS — I447 Left bundle-branch block, unspecified: Secondary | ICD-10-CM | POA: Diagnosis not present

## 2021-12-24 DIAGNOSIS — I2510 Atherosclerotic heart disease of native coronary artery without angina pectoris: Secondary | ICD-10-CM | POA: Diagnosis not present

## 2021-12-29 DIAGNOSIS — B351 Tinea unguium: Secondary | ICD-10-CM | POA: Diagnosis not present

## 2021-12-29 DIAGNOSIS — E1159 Type 2 diabetes mellitus with other circulatory complications: Secondary | ICD-10-CM | POA: Diagnosis not present

## 2021-12-29 DIAGNOSIS — I739 Peripheral vascular disease, unspecified: Secondary | ICD-10-CM | POA: Diagnosis not present

## 2022-01-05 DIAGNOSIS — I214 Non-ST elevation (NSTEMI) myocardial infarction: Secondary | ICD-10-CM | POA: Diagnosis not present

## 2022-01-05 DIAGNOSIS — E1165 Type 2 diabetes mellitus with hyperglycemia: Secondary | ICD-10-CM | POA: Diagnosis not present

## 2022-01-05 DIAGNOSIS — R5381 Other malaise: Secondary | ICD-10-CM | POA: Diagnosis not present

## 2022-01-05 DIAGNOSIS — Z794 Long term (current) use of insulin: Secondary | ICD-10-CM | POA: Diagnosis not present

## 2022-01-05 DIAGNOSIS — I2510 Atherosclerotic heart disease of native coronary artery without angina pectoris: Secondary | ICD-10-CM | POA: Diagnosis not present

## 2022-01-05 DIAGNOSIS — I10 Essential (primary) hypertension: Secondary | ICD-10-CM | POA: Diagnosis not present

## 2022-01-05 DIAGNOSIS — E785 Hyperlipidemia, unspecified: Secondary | ICD-10-CM | POA: Diagnosis not present

## 2022-01-05 DIAGNOSIS — I429 Cardiomyopathy, unspecified: Secondary | ICD-10-CM | POA: Diagnosis not present

## 2022-01-05 DIAGNOSIS — E119 Type 2 diabetes mellitus without complications: Secondary | ICD-10-CM | POA: Diagnosis not present

## 2022-01-25 DIAGNOSIS — R627 Adult failure to thrive: Secondary | ICD-10-CM | POA: Diagnosis not present

## 2022-01-25 DIAGNOSIS — I10 Essential (primary) hypertension: Secondary | ICD-10-CM | POA: Diagnosis not present

## 2022-01-25 DIAGNOSIS — E039 Hypothyroidism, unspecified: Secondary | ICD-10-CM | POA: Diagnosis not present

## 2022-01-25 DIAGNOSIS — E785 Hyperlipidemia, unspecified: Secondary | ICD-10-CM | POA: Diagnosis not present

## 2022-01-25 DIAGNOSIS — I214 Non-ST elevation (NSTEMI) myocardial infarction: Secondary | ICD-10-CM | POA: Diagnosis not present

## 2022-01-25 DIAGNOSIS — E1165 Type 2 diabetes mellitus with hyperglycemia: Secondary | ICD-10-CM | POA: Diagnosis not present

## 2022-01-25 DIAGNOSIS — I429 Cardiomyopathy, unspecified: Secondary | ICD-10-CM | POA: Diagnosis not present

## 2022-01-25 DIAGNOSIS — E119 Type 2 diabetes mellitus without complications: Secondary | ICD-10-CM | POA: Diagnosis not present

## 2022-01-25 DIAGNOSIS — I2510 Atherosclerotic heart disease of native coronary artery without angina pectoris: Secondary | ICD-10-CM | POA: Diagnosis not present

## 2022-02-02 DIAGNOSIS — E119 Type 2 diabetes mellitus without complications: Secondary | ICD-10-CM | POA: Diagnosis not present

## 2022-02-02 DIAGNOSIS — R5381 Other malaise: Secondary | ICD-10-CM | POA: Diagnosis not present

## 2022-02-02 DIAGNOSIS — I429 Cardiomyopathy, unspecified: Secondary | ICD-10-CM | POA: Diagnosis not present

## 2022-02-02 DIAGNOSIS — I10 Essential (primary) hypertension: Secondary | ICD-10-CM | POA: Diagnosis not present

## 2022-02-02 DIAGNOSIS — I2510 Atherosclerotic heart disease of native coronary artery without angina pectoris: Secondary | ICD-10-CM | POA: Diagnosis not present

## 2022-02-02 DIAGNOSIS — F32A Depression, unspecified: Secondary | ICD-10-CM | POA: Diagnosis not present

## 2022-02-17 DIAGNOSIS — J9601 Acute respiratory failure with hypoxia: Secondary | ICD-10-CM | POA: Diagnosis not present

## 2022-02-17 DIAGNOSIS — R1310 Dysphagia, unspecified: Secondary | ICD-10-CM | POA: Diagnosis not present

## 2022-02-17 DIAGNOSIS — I214 Non-ST elevation (NSTEMI) myocardial infarction: Secondary | ICD-10-CM | POA: Diagnosis not present

## 2022-02-17 DIAGNOSIS — K219 Gastro-esophageal reflux disease without esophagitis: Secondary | ICD-10-CM | POA: Diagnosis not present

## 2022-02-18 DIAGNOSIS — K219 Gastro-esophageal reflux disease without esophagitis: Secondary | ICD-10-CM | POA: Diagnosis not present

## 2022-02-18 DIAGNOSIS — I214 Non-ST elevation (NSTEMI) myocardial infarction: Secondary | ICD-10-CM | POA: Diagnosis not present

## 2022-02-18 DIAGNOSIS — J9601 Acute respiratory failure with hypoxia: Secondary | ICD-10-CM | POA: Diagnosis not present

## 2022-02-18 DIAGNOSIS — R1310 Dysphagia, unspecified: Secondary | ICD-10-CM | POA: Diagnosis not present

## 2022-02-21 DIAGNOSIS — I214 Non-ST elevation (NSTEMI) myocardial infarction: Secondary | ICD-10-CM | POA: Diagnosis not present

## 2022-02-21 DIAGNOSIS — K219 Gastro-esophageal reflux disease without esophagitis: Secondary | ICD-10-CM | POA: Diagnosis not present

## 2022-02-21 DIAGNOSIS — R1310 Dysphagia, unspecified: Secondary | ICD-10-CM | POA: Diagnosis not present

## 2022-02-21 DIAGNOSIS — J9601 Acute respiratory failure with hypoxia: Secondary | ICD-10-CM | POA: Diagnosis not present

## 2022-02-22 DIAGNOSIS — F32A Depression, unspecified: Secondary | ICD-10-CM | POA: Diagnosis not present

## 2022-02-22 DIAGNOSIS — I2510 Atherosclerotic heart disease of native coronary artery without angina pectoris: Secondary | ICD-10-CM | POA: Diagnosis not present

## 2022-02-22 DIAGNOSIS — J9601 Acute respiratory failure with hypoxia: Secondary | ICD-10-CM | POA: Diagnosis not present

## 2022-02-22 DIAGNOSIS — I10 Essential (primary) hypertension: Secondary | ICD-10-CM | POA: Diagnosis not present

## 2022-02-22 DIAGNOSIS — E119 Type 2 diabetes mellitus without complications: Secondary | ICD-10-CM | POA: Diagnosis not present

## 2022-02-22 DIAGNOSIS — I429 Cardiomyopathy, unspecified: Secondary | ICD-10-CM | POA: Diagnosis not present

## 2022-02-22 DIAGNOSIS — I214 Non-ST elevation (NSTEMI) myocardial infarction: Secondary | ICD-10-CM | POA: Diagnosis not present

## 2022-02-22 DIAGNOSIS — K7469 Other cirrhosis of liver: Secondary | ICD-10-CM | POA: Diagnosis not present

## 2022-02-22 DIAGNOSIS — K219 Gastro-esophageal reflux disease without esophagitis: Secondary | ICD-10-CM | POA: Diagnosis not present

## 2022-02-22 DIAGNOSIS — R5381 Other malaise: Secondary | ICD-10-CM | POA: Diagnosis not present

## 2022-02-22 DIAGNOSIS — R1310 Dysphagia, unspecified: Secondary | ICD-10-CM | POA: Diagnosis not present

## 2022-03-02 DIAGNOSIS — I2510 Atherosclerotic heart disease of native coronary artery without angina pectoris: Secondary | ICD-10-CM | POA: Diagnosis not present

## 2022-03-02 DIAGNOSIS — I429 Cardiomyopathy, unspecified: Secondary | ICD-10-CM | POA: Diagnosis not present

## 2022-03-02 DIAGNOSIS — E1165 Type 2 diabetes mellitus with hyperglycemia: Secondary | ICD-10-CM | POA: Diagnosis not present

## 2022-03-02 DIAGNOSIS — I10 Essential (primary) hypertension: Secondary | ICD-10-CM | POA: Diagnosis not present

## 2022-03-18 DIAGNOSIS — E119 Type 2 diabetes mellitus without complications: Secondary | ICD-10-CM | POA: Diagnosis not present

## 2022-03-18 DIAGNOSIS — I214 Non-ST elevation (NSTEMI) myocardial infarction: Secondary | ICD-10-CM | POA: Diagnosis not present

## 2022-03-18 DIAGNOSIS — E785 Hyperlipidemia, unspecified: Secondary | ICD-10-CM | POA: Diagnosis not present

## 2022-03-18 DIAGNOSIS — I10 Essential (primary) hypertension: Secondary | ICD-10-CM | POA: Diagnosis not present

## 2022-03-18 DIAGNOSIS — E1165 Type 2 diabetes mellitus with hyperglycemia: Secondary | ICD-10-CM | POA: Diagnosis not present

## 2022-03-18 DIAGNOSIS — I2510 Atherosclerotic heart disease of native coronary artery without angina pectoris: Secondary | ICD-10-CM | POA: Diagnosis not present

## 2022-03-26 DIAGNOSIS — E1165 Type 2 diabetes mellitus with hyperglycemia: Secondary | ICD-10-CM | POA: Diagnosis not present

## 2022-03-26 DIAGNOSIS — E785 Hyperlipidemia, unspecified: Secondary | ICD-10-CM | POA: Diagnosis not present

## 2022-03-26 DIAGNOSIS — E119 Type 2 diabetes mellitus without complications: Secondary | ICD-10-CM | POA: Diagnosis not present

## 2022-03-26 DIAGNOSIS — I2510 Atherosclerotic heart disease of native coronary artery without angina pectoris: Secondary | ICD-10-CM | POA: Diagnosis not present

## 2022-03-26 DIAGNOSIS — I214 Non-ST elevation (NSTEMI) myocardial infarction: Secondary | ICD-10-CM | POA: Diagnosis not present

## 2022-03-26 DIAGNOSIS — I10 Essential (primary) hypertension: Secondary | ICD-10-CM | POA: Diagnosis not present

## 2022-03-29 DIAGNOSIS — E1165 Type 2 diabetes mellitus with hyperglycemia: Secondary | ICD-10-CM | POA: Diagnosis not present

## 2022-03-29 DIAGNOSIS — B351 Tinea unguium: Secondary | ICD-10-CM | POA: Diagnosis not present

## 2022-03-29 DIAGNOSIS — Z794 Long term (current) use of insulin: Secondary | ICD-10-CM | POA: Diagnosis not present

## 2022-03-29 DIAGNOSIS — R5381 Other malaise: Secondary | ICD-10-CM | POA: Diagnosis not present

## 2022-03-29 DIAGNOSIS — I2510 Atherosclerotic heart disease of native coronary artery without angina pectoris: Secondary | ICD-10-CM | POA: Diagnosis not present

## 2022-03-29 DIAGNOSIS — I429 Cardiomyopathy, unspecified: Secondary | ICD-10-CM | POA: Diagnosis not present

## 2022-03-29 DIAGNOSIS — E1159 Type 2 diabetes mellitus with other circulatory complications: Secondary | ICD-10-CM | POA: Diagnosis not present

## 2022-03-30 DIAGNOSIS — Z794 Long term (current) use of insulin: Secondary | ICD-10-CM | POA: Diagnosis not present

## 2022-03-30 DIAGNOSIS — E1165 Type 2 diabetes mellitus with hyperglycemia: Secondary | ICD-10-CM | POA: Diagnosis not present

## 2022-03-30 DIAGNOSIS — R531 Weakness: Secondary | ICD-10-CM | POA: Diagnosis not present

## 2022-03-30 DIAGNOSIS — F32A Depression, unspecified: Secondary | ICD-10-CM | POA: Diagnosis not present

## 2022-04-20 ENCOUNTER — Ambulatory Visit: Payer: Self-pay

## 2022-04-20 NOTE — Patient Instructions (Signed)
Visit Information  Thank you for taking time to visit with me today. Please don't hesitate to contact me if I can be of assistance to you.   Following are the goals we discussed today:   Goals Addressed             This Visit's Progress    Obtain better one on one care from provider       Care Coordination Interventions: Discussed patient remains in residential care at [facility]  Mr. Hawker advises patient continues to use in house MD for primary care but he wishes there was more communication and oversight Educated on [place] and discussed this may be an option if allowable with Karenann Cai to have another care team member to assist with oversight of patient care needs         If you are experiencing a Mental Health or [place] or need someone to talk to, please call 1-800-273-TALK (toll free, 24 hour hotline)  Patient verbalizes understanding of instructions and care plan provided today and agrees to view in [place]. Active MyChart status and patient understanding of how to access instructions and care plan via MyChart confirmed with patient.     No further follow up required: Mr. Brunet will follow up with Karenann Cai regarding the addition of a Palliative Care team for the patient.  Daneen Schick, BSW, CDP Social Worker, Certified Dementia Practitioner Care Coordination [phone]

## 2022-04-20 NOTE — Patient Outreach (Addendum)
  Care Coordination   Initial Visit Note   04/20/2022 Name: Stefanie Houston MRN: [id number] DOB: 1941-09-16  Stefanie Houston is a 81 y.o. year old female who sees Ngetich, Nelda Bucks, NP for primary care. I  spoke with patients spouse by phone today.  What matters to the patients health and wellness today?  To have more focus on health care needs    Goals Addressed             This Visit's Progress    COMPLETED: Obtain better one on one care from provider       Care Coordination Interventions: Discussed patient remains in residential care at [facility]  Mr. Neukam advises patient continues to use in house MD for primary care but he wishes there was more communication and oversight Educated on [place] and discussed this may be an option if allowable with Karenann Cai to have another care team member to assist with oversight of patient care needs        SDOH assessments and interventions completed:  Yes  SDOH Interventions Today    Flowsheet Row Most Recent Value  SDOH Interventions   Food Insecurity Interventions Intervention Not Indicated  Housing Interventions Intervention Not Indicated  Transportation Interventions Intervention Not Indicated        Care Coordination Interventions Activated:  Yes  Care Coordination Interventions:  Yes, provided   Follow up plan: No further intervention required.   Encounter Outcome:  Pt. Visit Completed   Daneen Schick, BSW, CDP Social Worker, Certified Dementia Practitioner Care Coordination [phone]

## 2022-04-25 DIAGNOSIS — I10 Essential (primary) hypertension: Secondary | ICD-10-CM | POA: Diagnosis not present

## 2022-04-25 DIAGNOSIS — E119 Type 2 diabetes mellitus without complications: Secondary | ICD-10-CM | POA: Diagnosis not present

## 2022-04-25 DIAGNOSIS — E785 Hyperlipidemia, unspecified: Secondary | ICD-10-CM | POA: Diagnosis not present

## 2022-04-25 DIAGNOSIS — I214 Non-ST elevation (NSTEMI) myocardial infarction: Secondary | ICD-10-CM | POA: Diagnosis not present

## 2022-04-25 DIAGNOSIS — E1165 Type 2 diabetes mellitus with hyperglycemia: Secondary | ICD-10-CM | POA: Diagnosis not present

## 2022-04-25 DIAGNOSIS — I2510 Atherosclerotic heart disease of native coronary artery without angina pectoris: Secondary | ICD-10-CM | POA: Diagnosis not present

## 2022-04-26 DIAGNOSIS — F32A Depression, unspecified: Secondary | ICD-10-CM | POA: Diagnosis not present

## 2022-04-26 DIAGNOSIS — R531 Weakness: Secondary | ICD-10-CM | POA: Diagnosis not present

## 2022-04-26 DIAGNOSIS — E1165 Type 2 diabetes mellitus with hyperglycemia: Secondary | ICD-10-CM | POA: Diagnosis not present

## 2022-05-03 DIAGNOSIS — R627 Adult failure to thrive: Secondary | ICD-10-CM | POA: Diagnosis not present

## 2022-05-03 DIAGNOSIS — I509 Heart failure, unspecified: Secondary | ICD-10-CM | POA: Diagnosis not present

## 2022-05-03 DIAGNOSIS — M6281 Muscle weakness (generalized): Secondary | ICD-10-CM | POA: Diagnosis not present

## 2022-05-03 DIAGNOSIS — Z9181 History of falling: Secondary | ICD-10-CM | POA: Diagnosis not present

## 2022-05-03 DIAGNOSIS — R2681 Unsteadiness on feet: Secondary | ICD-10-CM | POA: Diagnosis not present

## 2022-05-03 DIAGNOSIS — M6250 Muscle wasting and atrophy, not elsewhere classified, unspecified site: Secondary | ICD-10-CM | POA: Diagnosis not present

## 2022-05-04 DIAGNOSIS — M6281 Muscle weakness (generalized): Secondary | ICD-10-CM | POA: Diagnosis not present

## 2022-05-04 DIAGNOSIS — Z9181 History of falling: Secondary | ICD-10-CM | POA: Diagnosis not present

## 2022-05-04 DIAGNOSIS — M6250 Muscle wasting and atrophy, not elsewhere classified, unspecified site: Secondary | ICD-10-CM | POA: Diagnosis not present

## 2022-05-04 DIAGNOSIS — R627 Adult failure to thrive: Secondary | ICD-10-CM | POA: Diagnosis not present

## 2022-05-04 DIAGNOSIS — R2681 Unsteadiness on feet: Secondary | ICD-10-CM | POA: Diagnosis not present

## 2022-05-04 DIAGNOSIS — I509 Heart failure, unspecified: Secondary | ICD-10-CM | POA: Diagnosis not present

## 2022-05-05 DIAGNOSIS — R627 Adult failure to thrive: Secondary | ICD-10-CM | POA: Diagnosis not present

## 2022-05-05 DIAGNOSIS — M6250 Muscle wasting and atrophy, not elsewhere classified, unspecified site: Secondary | ICD-10-CM | POA: Diagnosis not present

## 2022-05-05 DIAGNOSIS — M6281 Muscle weakness (generalized): Secondary | ICD-10-CM | POA: Diagnosis not present

## 2022-05-05 DIAGNOSIS — I509 Heart failure, unspecified: Secondary | ICD-10-CM | POA: Diagnosis not present

## 2022-05-05 DIAGNOSIS — Z9181 History of falling: Secondary | ICD-10-CM | POA: Diagnosis not present

## 2022-05-05 DIAGNOSIS — R2681 Unsteadiness on feet: Secondary | ICD-10-CM | POA: Diagnosis not present

## 2022-05-06 DIAGNOSIS — R627 Adult failure to thrive: Secondary | ICD-10-CM | POA: Diagnosis not present

## 2022-05-06 DIAGNOSIS — M6250 Muscle wasting and atrophy, not elsewhere classified, unspecified site: Secondary | ICD-10-CM | POA: Diagnosis not present

## 2022-05-06 DIAGNOSIS — Z9181 History of falling: Secondary | ICD-10-CM | POA: Diagnosis not present

## 2022-05-06 DIAGNOSIS — I509 Heart failure, unspecified: Secondary | ICD-10-CM | POA: Diagnosis not present

## 2022-05-06 DIAGNOSIS — M6281 Muscle weakness (generalized): Secondary | ICD-10-CM | POA: Diagnosis not present

## 2022-05-06 DIAGNOSIS — R2681 Unsteadiness on feet: Secondary | ICD-10-CM | POA: Diagnosis not present

## 2022-05-09 DIAGNOSIS — M6250 Muscle wasting and atrophy, not elsewhere classified, unspecified site: Secondary | ICD-10-CM | POA: Diagnosis not present

## 2022-05-09 DIAGNOSIS — R2681 Unsteadiness on feet: Secondary | ICD-10-CM | POA: Diagnosis not present

## 2022-05-09 DIAGNOSIS — Z9181 History of falling: Secondary | ICD-10-CM | POA: Diagnosis not present

## 2022-05-09 DIAGNOSIS — R627 Adult failure to thrive: Secondary | ICD-10-CM | POA: Diagnosis not present

## 2022-05-09 DIAGNOSIS — I509 Heart failure, unspecified: Secondary | ICD-10-CM | POA: Diagnosis not present

## 2022-05-09 DIAGNOSIS — M6281 Muscle weakness (generalized): Secondary | ICD-10-CM | POA: Diagnosis not present

## 2022-05-10 DIAGNOSIS — R2681 Unsteadiness on feet: Secondary | ICD-10-CM | POA: Diagnosis not present

## 2022-05-10 DIAGNOSIS — I509 Heart failure, unspecified: Secondary | ICD-10-CM | POA: Diagnosis not present

## 2022-05-10 DIAGNOSIS — M6250 Muscle wasting and atrophy, not elsewhere classified, unspecified site: Secondary | ICD-10-CM | POA: Diagnosis not present

## 2022-05-10 DIAGNOSIS — Z9181 History of falling: Secondary | ICD-10-CM | POA: Diagnosis not present

## 2022-05-10 DIAGNOSIS — R627 Adult failure to thrive: Secondary | ICD-10-CM | POA: Diagnosis not present

## 2022-05-10 DIAGNOSIS — M6281 Muscle weakness (generalized): Secondary | ICD-10-CM | POA: Diagnosis not present

## 2022-05-11 DIAGNOSIS — M6281 Muscle weakness (generalized): Secondary | ICD-10-CM | POA: Diagnosis not present

## 2022-05-11 DIAGNOSIS — R627 Adult failure to thrive: Secondary | ICD-10-CM | POA: Diagnosis not present

## 2022-05-11 DIAGNOSIS — Z9181 History of falling: Secondary | ICD-10-CM | POA: Diagnosis not present

## 2022-05-11 DIAGNOSIS — I509 Heart failure, unspecified: Secondary | ICD-10-CM | POA: Diagnosis not present

## 2022-05-11 DIAGNOSIS — M6250 Muscle wasting and atrophy, not elsewhere classified, unspecified site: Secondary | ICD-10-CM | POA: Diagnosis not present

## 2022-05-11 DIAGNOSIS — R2681 Unsteadiness on feet: Secondary | ICD-10-CM | POA: Diagnosis not present

## 2022-05-12 DIAGNOSIS — R627 Adult failure to thrive: Secondary | ICD-10-CM | POA: Diagnosis not present

## 2022-05-12 DIAGNOSIS — M6250 Muscle wasting and atrophy, not elsewhere classified, unspecified site: Secondary | ICD-10-CM | POA: Diagnosis not present

## 2022-05-12 DIAGNOSIS — R2681 Unsteadiness on feet: Secondary | ICD-10-CM | POA: Diagnosis not present

## 2022-05-12 DIAGNOSIS — M6281 Muscle weakness (generalized): Secondary | ICD-10-CM | POA: Diagnosis not present

## 2022-05-12 DIAGNOSIS — I509 Heart failure, unspecified: Secondary | ICD-10-CM | POA: Diagnosis not present

## 2022-05-12 DIAGNOSIS — Z9181 History of falling: Secondary | ICD-10-CM | POA: Diagnosis not present

## 2022-05-13 DIAGNOSIS — M6281 Muscle weakness (generalized): Secondary | ICD-10-CM | POA: Diagnosis not present

## 2022-05-13 DIAGNOSIS — R627 Adult failure to thrive: Secondary | ICD-10-CM | POA: Diagnosis not present

## 2022-05-13 DIAGNOSIS — R2681 Unsteadiness on feet: Secondary | ICD-10-CM | POA: Diagnosis not present

## 2022-05-13 DIAGNOSIS — Z9181 History of falling: Secondary | ICD-10-CM | POA: Diagnosis not present

## 2022-05-13 DIAGNOSIS — I509 Heart failure, unspecified: Secondary | ICD-10-CM | POA: Diagnosis not present

## 2022-05-13 DIAGNOSIS — M6250 Muscle wasting and atrophy, not elsewhere classified, unspecified site: Secondary | ICD-10-CM | POA: Diagnosis not present

## 2022-05-16 DIAGNOSIS — R627 Adult failure to thrive: Secondary | ICD-10-CM | POA: Diagnosis not present

## 2022-05-16 DIAGNOSIS — Z9181 History of falling: Secondary | ICD-10-CM | POA: Diagnosis not present

## 2022-05-16 DIAGNOSIS — M6250 Muscle wasting and atrophy, not elsewhere classified, unspecified site: Secondary | ICD-10-CM | POA: Diagnosis not present

## 2022-05-16 DIAGNOSIS — M6281 Muscle weakness (generalized): Secondary | ICD-10-CM | POA: Diagnosis not present

## 2022-05-16 DIAGNOSIS — R2681 Unsteadiness on feet: Secondary | ICD-10-CM | POA: Diagnosis not present

## 2022-05-16 DIAGNOSIS — I509 Heart failure, unspecified: Secondary | ICD-10-CM | POA: Diagnosis not present

## 2022-05-17 DIAGNOSIS — I509 Heart failure, unspecified: Secondary | ICD-10-CM | POA: Diagnosis not present

## 2022-05-17 DIAGNOSIS — R2681 Unsteadiness on feet: Secondary | ICD-10-CM | POA: Diagnosis not present

## 2022-05-17 DIAGNOSIS — Z9181 History of falling: Secondary | ICD-10-CM | POA: Diagnosis not present

## 2022-05-17 DIAGNOSIS — R627 Adult failure to thrive: Secondary | ICD-10-CM | POA: Diagnosis not present

## 2022-05-17 DIAGNOSIS — M6281 Muscle weakness (generalized): Secondary | ICD-10-CM | POA: Diagnosis not present

## 2022-05-17 DIAGNOSIS — M6250 Muscle wasting and atrophy, not elsewhere classified, unspecified site: Secondary | ICD-10-CM | POA: Diagnosis not present

## 2022-05-18 DIAGNOSIS — R0602 Shortness of breath: Secondary | ICD-10-CM | POA: Diagnosis not present

## 2022-05-19 DIAGNOSIS — N390 Urinary tract infection, site not specified: Secondary | ICD-10-CM | POA: Diagnosis not present

## 2022-05-19 DIAGNOSIS — Z9181 History of falling: Secondary | ICD-10-CM | POA: Diagnosis not present

## 2022-05-19 DIAGNOSIS — R2681 Unsteadiness on feet: Secondary | ICD-10-CM | POA: Diagnosis not present

## 2022-05-19 DIAGNOSIS — R627 Adult failure to thrive: Secondary | ICD-10-CM | POA: Diagnosis not present

## 2022-05-19 DIAGNOSIS — I509 Heart failure, unspecified: Secondary | ICD-10-CM | POA: Diagnosis not present

## 2022-05-19 DIAGNOSIS — M6281 Muscle weakness (generalized): Secondary | ICD-10-CM | POA: Diagnosis not present

## 2022-05-19 DIAGNOSIS — M6250 Muscle wasting and atrophy, not elsewhere classified, unspecified site: Secondary | ICD-10-CM | POA: Diagnosis not present

## 2022-05-20 DIAGNOSIS — R052 Subacute cough: Secondary | ICD-10-CM | POA: Diagnosis not present

## 2022-05-20 DIAGNOSIS — I509 Heart failure, unspecified: Secondary | ICD-10-CM | POA: Diagnosis not present

## 2022-05-20 DIAGNOSIS — R0602 Shortness of breath: Secondary | ICD-10-CM | POA: Diagnosis not present

## 2022-05-20 DIAGNOSIS — M62838 Other muscle spasm: Secondary | ICD-10-CM | POA: Diagnosis not present

## 2022-05-20 DIAGNOSIS — M6281 Muscle weakness (generalized): Secondary | ICD-10-CM | POA: Diagnosis not present

## 2022-05-20 DIAGNOSIS — R252 Cramp and spasm: Secondary | ICD-10-CM | POA: Diagnosis not present

## 2022-05-20 DIAGNOSIS — R1310 Dysphagia, unspecified: Secondary | ICD-10-CM | POA: Diagnosis not present

## 2022-05-20 DIAGNOSIS — R532 Functional quadriplegia: Secondary | ICD-10-CM | POA: Diagnosis not present

## 2022-05-20 DIAGNOSIS — R2681 Unsteadiness on feet: Secondary | ICD-10-CM | POA: Diagnosis not present

## 2022-05-21 DIAGNOSIS — I509 Heart failure, unspecified: Secondary | ICD-10-CM | POA: Diagnosis not present

## 2022-05-23 DIAGNOSIS — I509 Heart failure, unspecified: Secondary | ICD-10-CM | POA: Diagnosis not present

## 2022-05-24 DIAGNOSIS — E1165 Type 2 diabetes mellitus with hyperglycemia: Secondary | ICD-10-CM | POA: Diagnosis not present

## 2022-05-24 DIAGNOSIS — F32A Depression, unspecified: Secondary | ICD-10-CM | POA: Diagnosis not present

## 2022-05-24 DIAGNOSIS — R531 Weakness: Secondary | ICD-10-CM | POA: Diagnosis not present

## 2022-05-25 DIAGNOSIS — M62838 Other muscle spasm: Secondary | ICD-10-CM | POA: Diagnosis not present

## 2022-05-25 DIAGNOSIS — R1310 Dysphagia, unspecified: Secondary | ICD-10-CM | POA: Diagnosis not present

## 2022-05-25 DIAGNOSIS — R532 Functional quadriplegia: Secondary | ICD-10-CM | POA: Diagnosis not present

## 2022-05-25 DIAGNOSIS — I509 Heart failure, unspecified: Secondary | ICD-10-CM | POA: Diagnosis not present

## 2022-05-25 DIAGNOSIS — J209 Acute bronchitis, unspecified: Secondary | ICD-10-CM | POA: Diagnosis not present

## 2022-05-25 DIAGNOSIS — R052 Subacute cough: Secondary | ICD-10-CM | POA: Diagnosis not present

## 2022-05-25 DIAGNOSIS — R0602 Shortness of breath: Secondary | ICD-10-CM | POA: Diagnosis not present

## 2022-05-26 DIAGNOSIS — R0602 Shortness of breath: Secondary | ICD-10-CM | POA: Diagnosis not present

## 2022-05-26 DIAGNOSIS — R1310 Dysphagia, unspecified: Secondary | ICD-10-CM | POA: Diagnosis not present

## 2022-05-26 DIAGNOSIS — R532 Functional quadriplegia: Secondary | ICD-10-CM | POA: Diagnosis not present

## 2022-05-26 DIAGNOSIS — M62838 Other muscle spasm: Secondary | ICD-10-CM | POA: Diagnosis not present

## 2022-05-26 DIAGNOSIS — I509 Heart failure, unspecified: Secondary | ICD-10-CM | POA: Diagnosis not present

## 2022-05-26 DIAGNOSIS — R052 Subacute cough: Secondary | ICD-10-CM | POA: Diagnosis not present

## 2022-05-27 DIAGNOSIS — R1310 Dysphagia, unspecified: Secondary | ICD-10-CM | POA: Diagnosis not present

## 2022-05-27 DIAGNOSIS — M62838 Other muscle spasm: Secondary | ICD-10-CM | POA: Diagnosis not present

## 2022-05-27 DIAGNOSIS — I214 Non-ST elevation (NSTEMI) myocardial infarction: Secondary | ICD-10-CM | POA: Diagnosis not present

## 2022-05-27 DIAGNOSIS — I10 Essential (primary) hypertension: Secondary | ICD-10-CM | POA: Diagnosis not present

## 2022-05-27 DIAGNOSIS — E119 Type 2 diabetes mellitus without complications: Secondary | ICD-10-CM | POA: Diagnosis not present

## 2022-05-27 DIAGNOSIS — R532 Functional quadriplegia: Secondary | ICD-10-CM | POA: Diagnosis not present

## 2022-05-27 DIAGNOSIS — I2510 Atherosclerotic heart disease of native coronary artery without angina pectoris: Secondary | ICD-10-CM | POA: Diagnosis not present

## 2022-05-27 DIAGNOSIS — E1165 Type 2 diabetes mellitus with hyperglycemia: Secondary | ICD-10-CM | POA: Diagnosis not present

## 2022-05-27 DIAGNOSIS — I509 Heart failure, unspecified: Secondary | ICD-10-CM | POA: Diagnosis not present

## 2022-05-27 DIAGNOSIS — R0602 Shortness of breath: Secondary | ICD-10-CM | POA: Diagnosis not present

## 2022-05-27 DIAGNOSIS — E785 Hyperlipidemia, unspecified: Secondary | ICD-10-CM | POA: Diagnosis not present

## 2022-05-27 DIAGNOSIS — R052 Subacute cough: Secondary | ICD-10-CM | POA: Diagnosis not present

## 2022-05-30 DIAGNOSIS — M62838 Other muscle spasm: Secondary | ICD-10-CM | POA: Diagnosis not present

## 2022-05-30 DIAGNOSIS — R0602 Shortness of breath: Secondary | ICD-10-CM | POA: Diagnosis not present

## 2022-05-30 DIAGNOSIS — R052 Subacute cough: Secondary | ICD-10-CM | POA: Diagnosis not present

## 2022-05-30 DIAGNOSIS — R532 Functional quadriplegia: Secondary | ICD-10-CM | POA: Diagnosis not present

## 2022-05-30 DIAGNOSIS — I509 Heart failure, unspecified: Secondary | ICD-10-CM | POA: Diagnosis not present

## 2022-05-30 DIAGNOSIS — R1310 Dysphagia, unspecified: Secondary | ICD-10-CM | POA: Diagnosis not present

## 2022-05-30 DIAGNOSIS — R059 Cough, unspecified: Secondary | ICD-10-CM | POA: Diagnosis not present

## 2022-05-30 DIAGNOSIS — R7989 Other specified abnormal findings of blood chemistry: Secondary | ICD-10-CM | POA: Diagnosis not present

## 2022-05-31 DIAGNOSIS — R0602 Shortness of breath: Secondary | ICD-10-CM | POA: Diagnosis not present

## 2022-05-31 DIAGNOSIS — R1310 Dysphagia, unspecified: Secondary | ICD-10-CM | POA: Diagnosis not present

## 2022-05-31 DIAGNOSIS — R052 Subacute cough: Secondary | ICD-10-CM | POA: Diagnosis not present

## 2022-05-31 DIAGNOSIS — M62838 Other muscle spasm: Secondary | ICD-10-CM | POA: Diagnosis not present

## 2022-05-31 DIAGNOSIS — I509 Heart failure, unspecified: Secondary | ICD-10-CM | POA: Diagnosis not present

## 2022-05-31 DIAGNOSIS — R532 Functional quadriplegia: Secondary | ICD-10-CM | POA: Diagnosis not present

## 2022-06-01 DIAGNOSIS — R052 Subacute cough: Secondary | ICD-10-CM | POA: Diagnosis not present

## 2022-06-01 DIAGNOSIS — R1310 Dysphagia, unspecified: Secondary | ICD-10-CM | POA: Diagnosis not present

## 2022-06-01 DIAGNOSIS — M62838 Other muscle spasm: Secondary | ICD-10-CM | POA: Diagnosis not present

## 2022-06-01 DIAGNOSIS — R532 Functional quadriplegia: Secondary | ICD-10-CM | POA: Diagnosis not present

## 2022-06-01 DIAGNOSIS — R0602 Shortness of breath: Secondary | ICD-10-CM | POA: Diagnosis not present

## 2022-06-01 DIAGNOSIS — I509 Heart failure, unspecified: Secondary | ICD-10-CM | POA: Diagnosis not present

## 2022-06-02 DIAGNOSIS — M62838 Other muscle spasm: Secondary | ICD-10-CM | POA: Diagnosis not present

## 2022-06-02 DIAGNOSIS — R0602 Shortness of breath: Secondary | ICD-10-CM | POA: Diagnosis not present

## 2022-06-02 DIAGNOSIS — I509 Heart failure, unspecified: Secondary | ICD-10-CM | POA: Diagnosis not present

## 2022-06-02 DIAGNOSIS — R052 Subacute cough: Secondary | ICD-10-CM | POA: Diagnosis not present

## 2022-06-02 DIAGNOSIS — R532 Functional quadriplegia: Secondary | ICD-10-CM | POA: Diagnosis not present

## 2022-06-02 DIAGNOSIS — R1310 Dysphagia, unspecified: Secondary | ICD-10-CM | POA: Diagnosis not present

## 2022-06-03 DIAGNOSIS — R532 Functional quadriplegia: Secondary | ICD-10-CM | POA: Diagnosis not present

## 2022-06-03 DIAGNOSIS — J189 Pneumonia, unspecified organism: Secondary | ICD-10-CM | POA: Diagnosis not present

## 2022-06-03 DIAGNOSIS — R5381 Other malaise: Secondary | ICD-10-CM | POA: Diagnosis not present

## 2022-06-03 DIAGNOSIS — E119 Type 2 diabetes mellitus without complications: Secondary | ICD-10-CM | POA: Diagnosis not present

## 2022-06-03 DIAGNOSIS — M62838 Other muscle spasm: Secondary | ICD-10-CM | POA: Diagnosis not present

## 2022-06-03 DIAGNOSIS — R1310 Dysphagia, unspecified: Secondary | ICD-10-CM | POA: Diagnosis not present

## 2022-06-03 DIAGNOSIS — I10 Essential (primary) hypertension: Secondary | ICD-10-CM | POA: Diagnosis not present

## 2022-06-03 DIAGNOSIS — R0602 Shortness of breath: Secondary | ICD-10-CM | POA: Diagnosis not present

## 2022-06-03 DIAGNOSIS — I509 Heart failure, unspecified: Secondary | ICD-10-CM | POA: Diagnosis not present

## 2022-06-03 DIAGNOSIS — R052 Subacute cough: Secondary | ICD-10-CM | POA: Diagnosis not present

## 2022-06-03 DIAGNOSIS — R531 Weakness: Secondary | ICD-10-CM | POA: Diagnosis not present

## 2022-06-05 DIAGNOSIS — M62838 Other muscle spasm: Secondary | ICD-10-CM | POA: Diagnosis not present

## 2022-06-05 DIAGNOSIS — R0602 Shortness of breath: Secondary | ICD-10-CM | POA: Diagnosis not present

## 2022-06-05 DIAGNOSIS — R052 Subacute cough: Secondary | ICD-10-CM | POA: Diagnosis not present

## 2022-06-05 DIAGNOSIS — R532 Functional quadriplegia: Secondary | ICD-10-CM | POA: Diagnosis not present

## 2022-06-05 DIAGNOSIS — I509 Heart failure, unspecified: Secondary | ICD-10-CM | POA: Diagnosis not present

## 2022-06-05 DIAGNOSIS — R1310 Dysphagia, unspecified: Secondary | ICD-10-CM | POA: Diagnosis not present

## 2022-06-06 DIAGNOSIS — I509 Heart failure, unspecified: Secondary | ICD-10-CM | POA: Diagnosis not present

## 2022-06-06 DIAGNOSIS — R0602 Shortness of breath: Secondary | ICD-10-CM | POA: Diagnosis not present

## 2022-06-06 DIAGNOSIS — R052 Subacute cough: Secondary | ICD-10-CM | POA: Diagnosis not present

## 2022-06-06 DIAGNOSIS — R532 Functional quadriplegia: Secondary | ICD-10-CM | POA: Diagnosis not present

## 2022-06-06 DIAGNOSIS — M62838 Other muscle spasm: Secondary | ICD-10-CM | POA: Diagnosis not present

## 2022-06-06 DIAGNOSIS — R1310 Dysphagia, unspecified: Secondary | ICD-10-CM | POA: Diagnosis not present

## 2022-06-07 DIAGNOSIS — R0602 Shortness of breath: Secondary | ICD-10-CM | POA: Diagnosis not present

## 2022-06-07 DIAGNOSIS — R1310 Dysphagia, unspecified: Secondary | ICD-10-CM | POA: Diagnosis not present

## 2022-06-07 DIAGNOSIS — R052 Subacute cough: Secondary | ICD-10-CM | POA: Diagnosis not present

## 2022-06-07 DIAGNOSIS — R532 Functional quadriplegia: Secondary | ICD-10-CM | POA: Diagnosis not present

## 2022-06-07 DIAGNOSIS — M62838 Other muscle spasm: Secondary | ICD-10-CM | POA: Diagnosis not present

## 2022-06-07 DIAGNOSIS — I509 Heart failure, unspecified: Secondary | ICD-10-CM | POA: Diagnosis not present

## 2022-06-07 IMAGING — DX DG ABD PORTABLE 2V
3 series · 3 of 3 positions shown · non-contrast
Comparison: None.

CLINICAL DATA: Bowel obstruction, vomiting

EXAM:
PORTABLE ABDOMEN - 2 VIEW

[abdomen erect (1 of 2)]
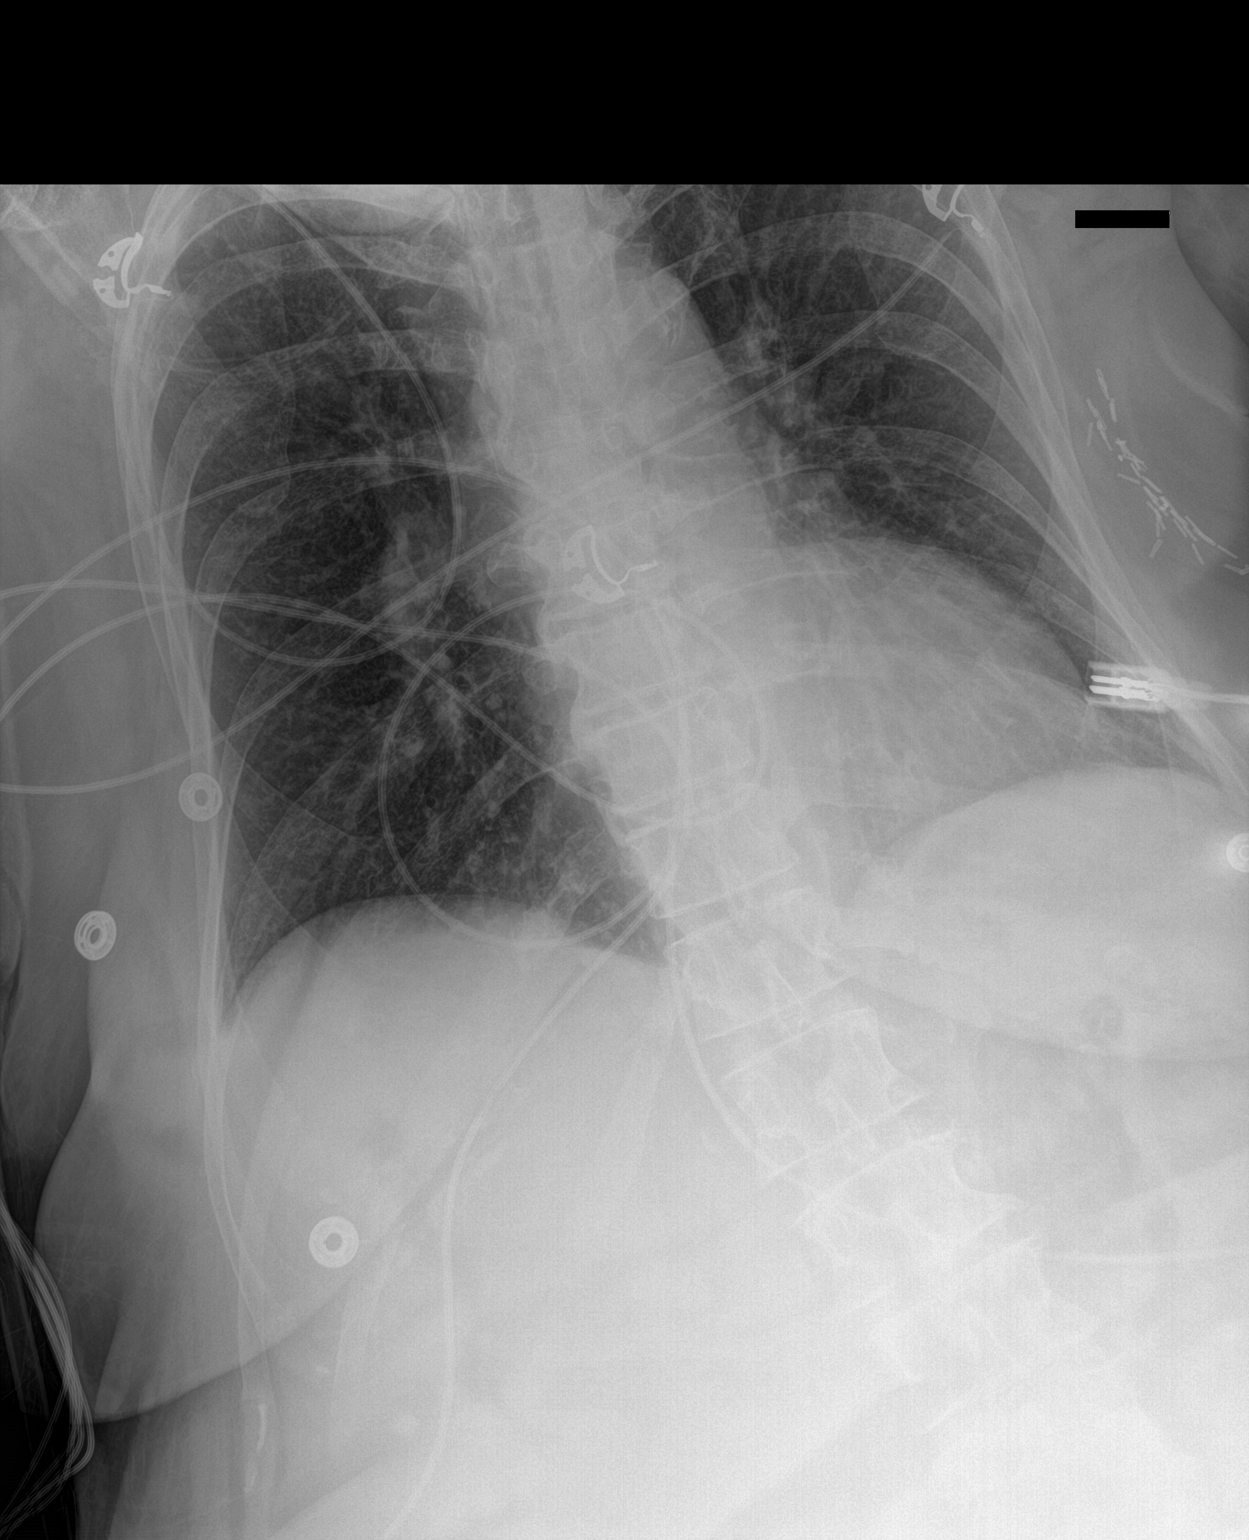

[abdomen erect (2 of 2)]
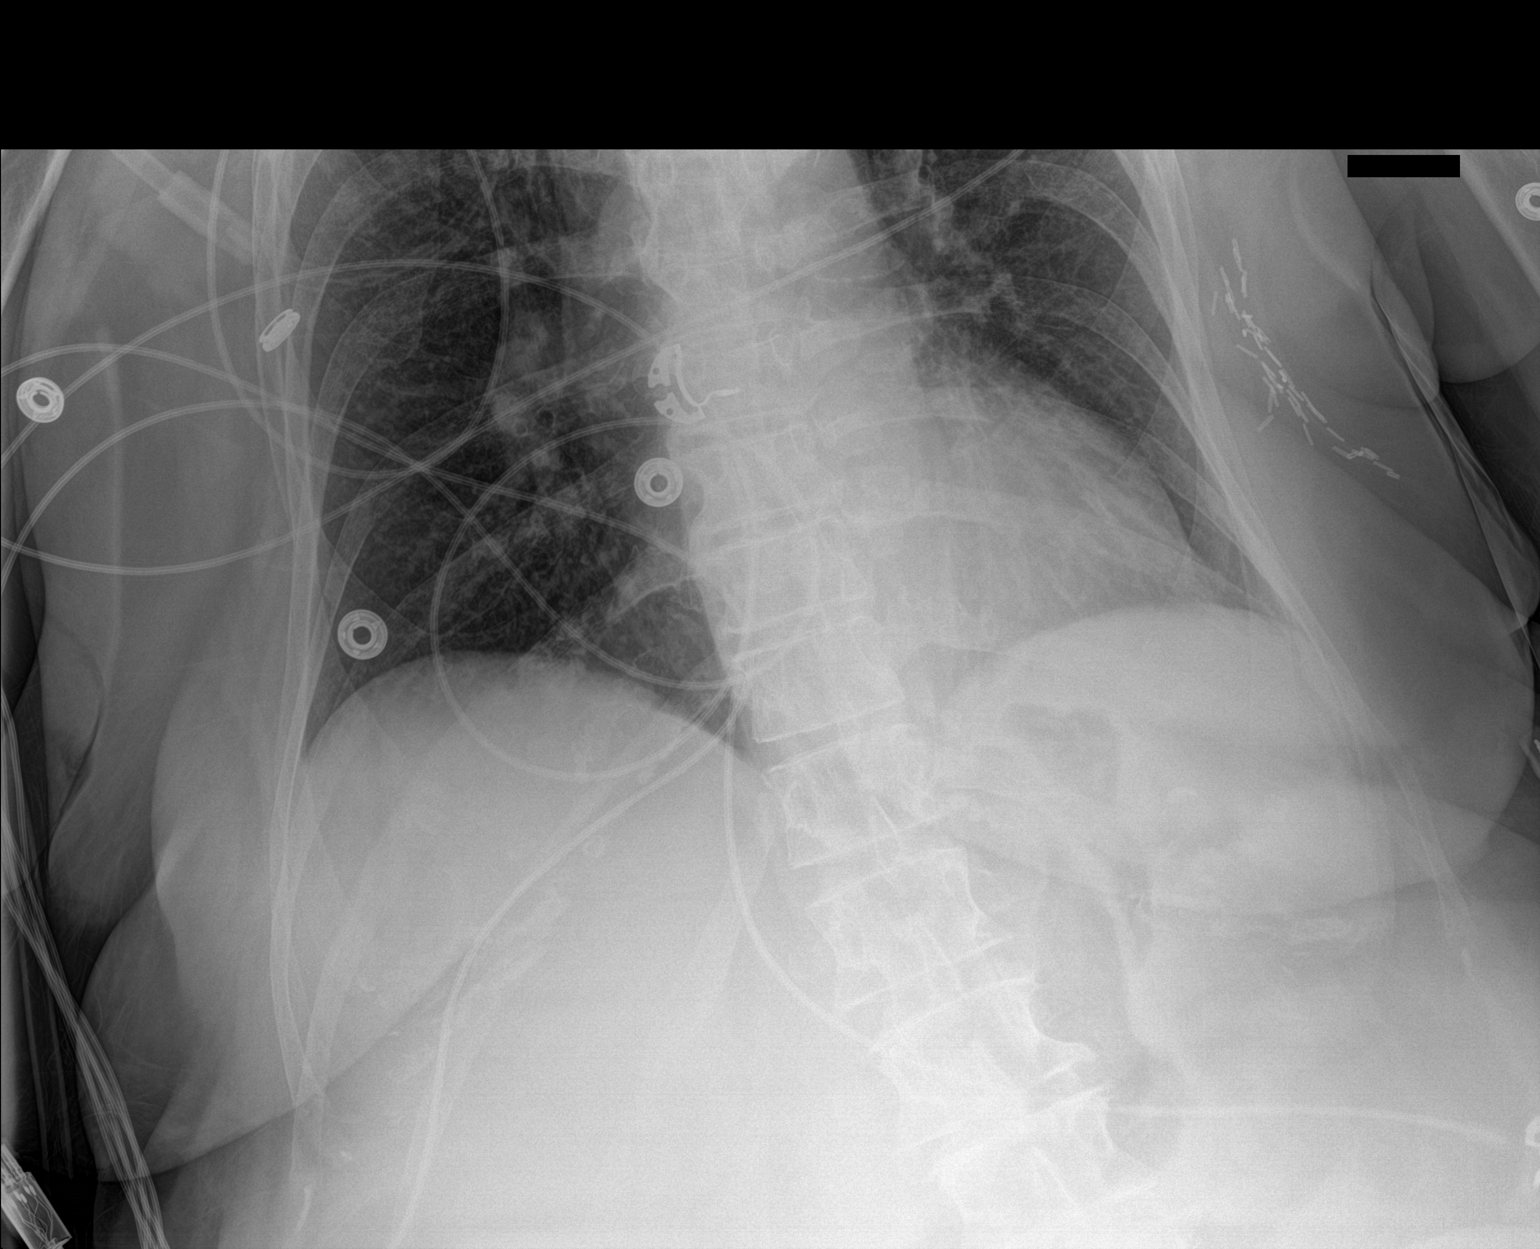

[abdomen supine]
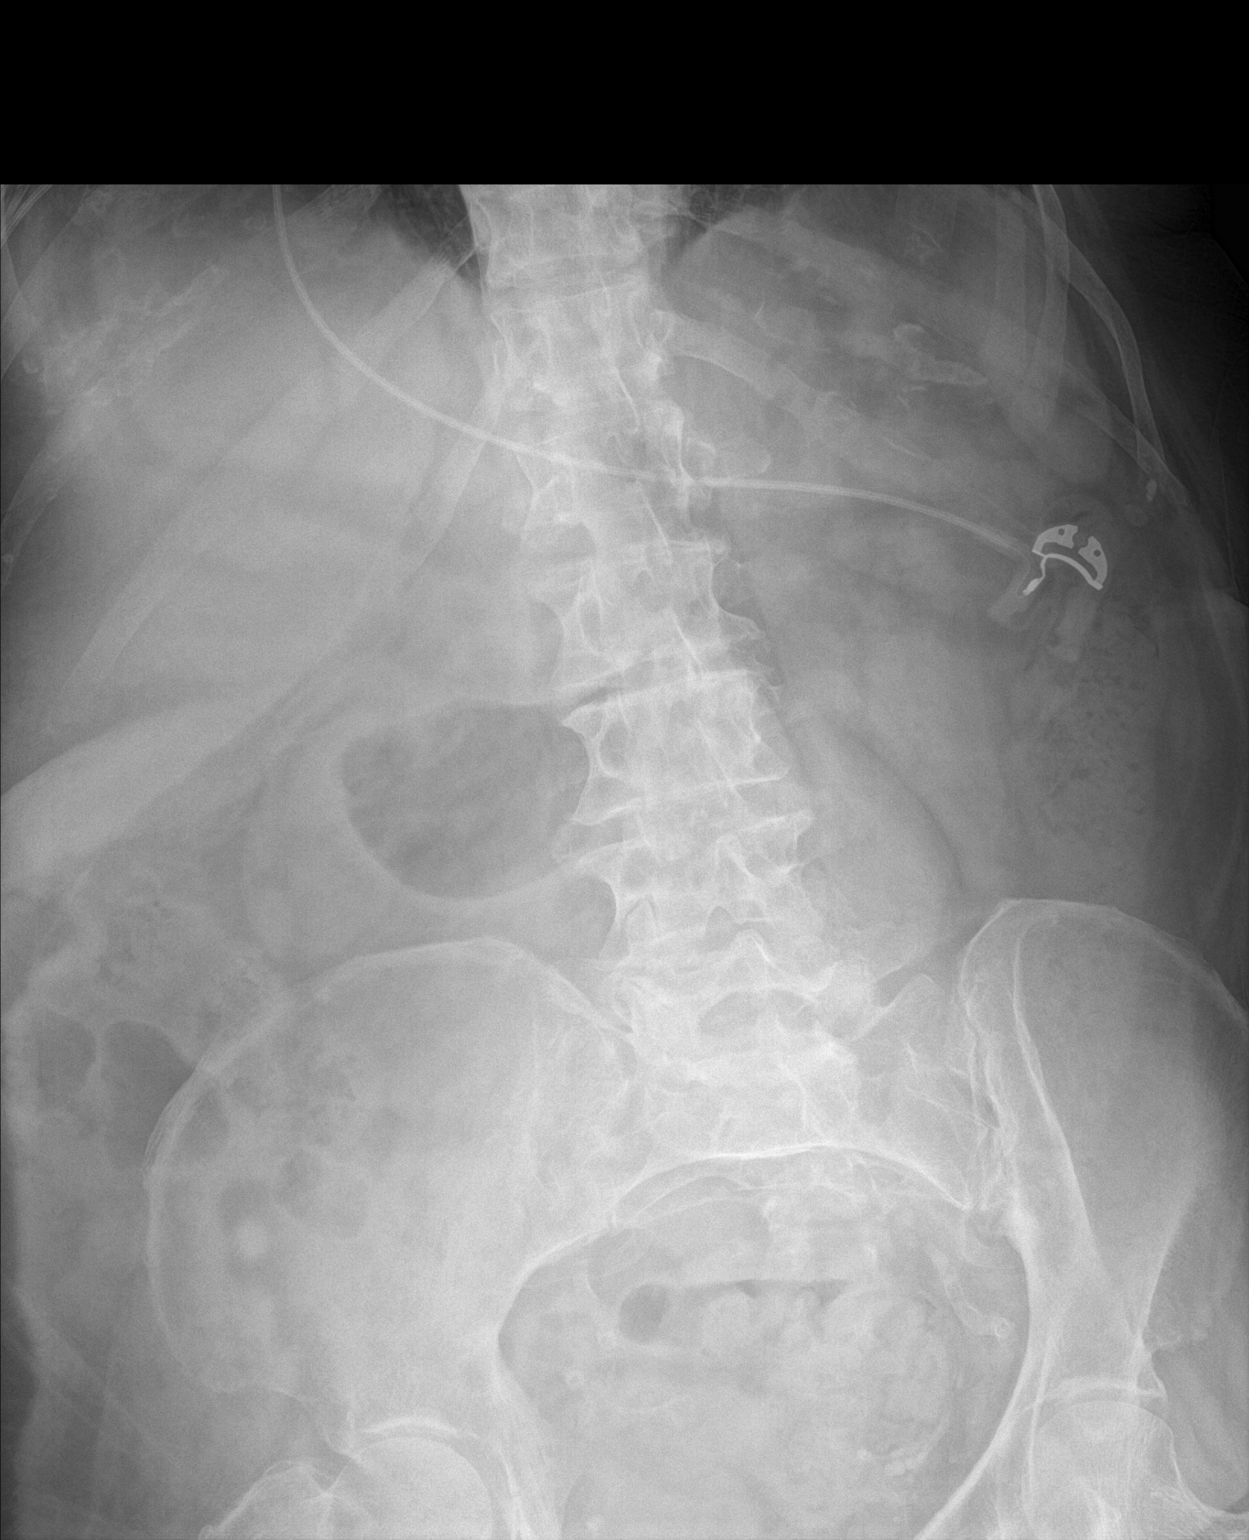

[3 of 3 positions shown; findings below may reference images not displayed]

FINDINGS: Lungs are clear. Lung apices are partially obscured, however, no
definite pneumothorax. No pleural effusion. Cardiac size within
normal limits. Pulmonary vascularity is normal. Surgical clips are
seen within the left axilla.

Normal abdominal gas pattern. No free intraperitoneal gas. Moderate
stool within the distal colon. Inferior pelvis is partially
excluded. No organomegaly. Vascular calcifications noted within the
pelvis.
IMPRESSION: Normal abdominal gas pattern.  Moderate stool.

## 2022-06-07 IMAGING — CT CT ABD-PELV W/O CM
2 of 4 series · 15 of 46 positions shown, 17 images · non-contrast
Comparison: CT the abdomen and pelvis 07/25/2020.

CLINICAL DATA: 80-year-old female with history of renal failure and
sepsis. Pyelonephritis.

EXAM:
CT ABDOMEN AND PELVIS WITHOUT CONTRAST
TECHNIQUE: Multidetector CT imaging of the abdomen and pelvis was performed
following the standard protocol without IV contrast.

[Series 3: ap without · axial · non-contrast · 0.88mm/px · z∈[-523,-93]mm · 12 of 98 slices shown, 14 images]
[im 6/98  soft-tissue]
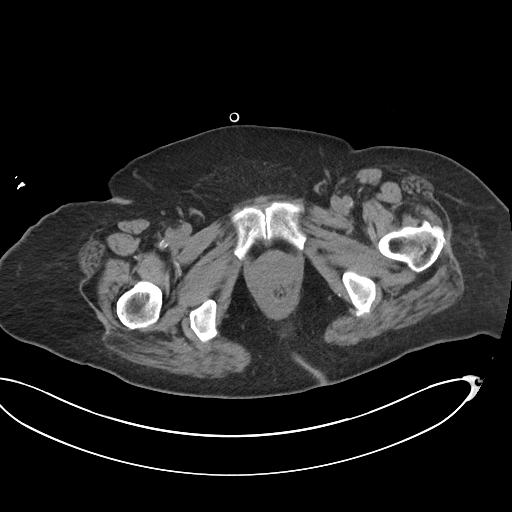
[im 6/98  bone]
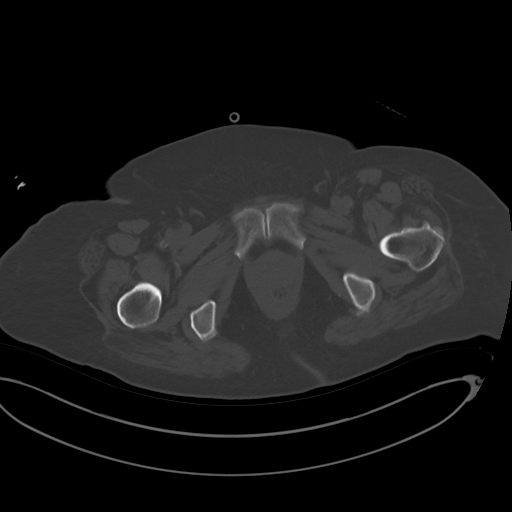
[im 17/98  soft-tissue]
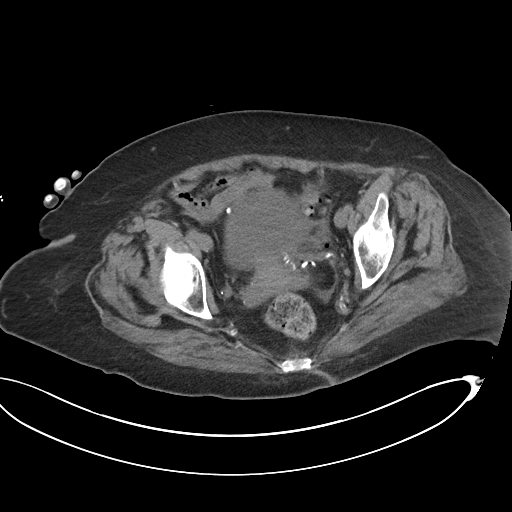
[im 22/98  soft-tissue]
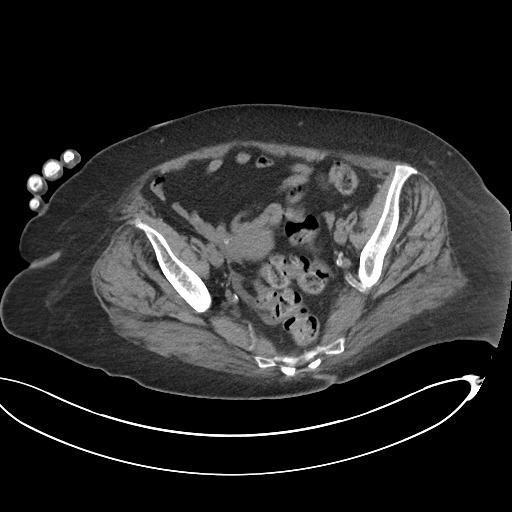
[im 27/98  soft-tissue]
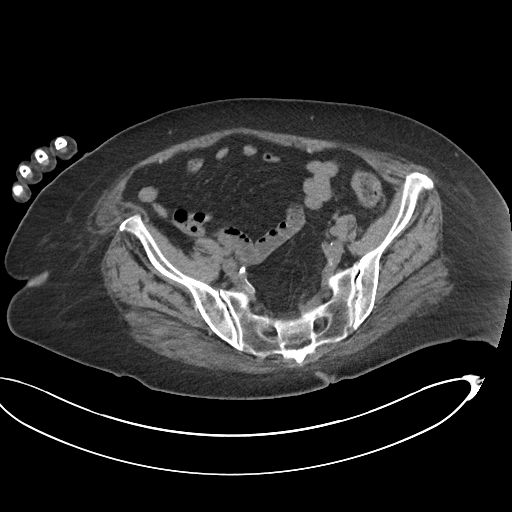
[im 38/98  soft-tissue]
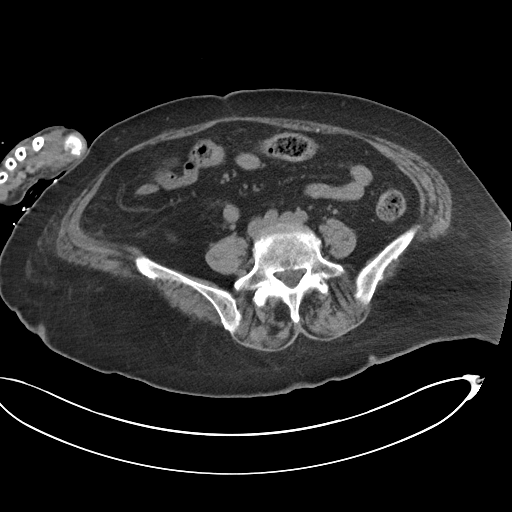
[im 44/98  soft-tissue]
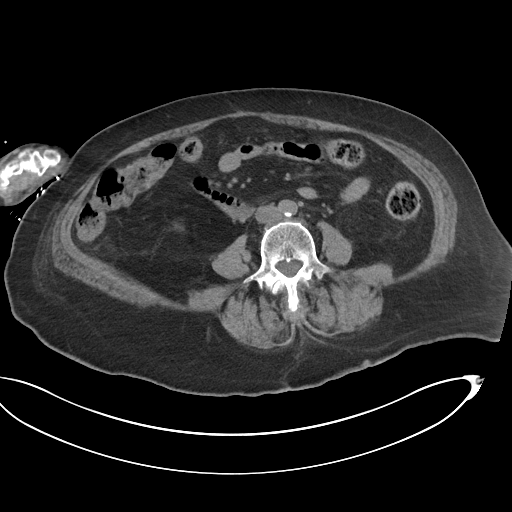
[im 54/98  soft-tissue]
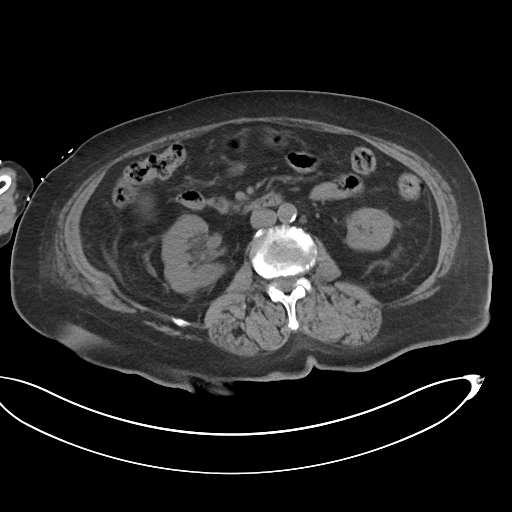
[im 60/98  soft-tissue]
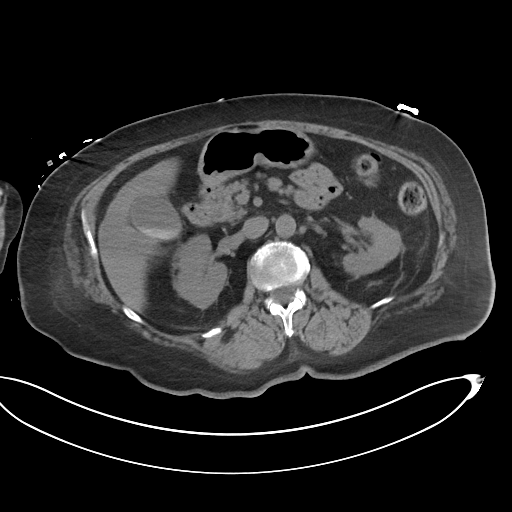
[im 71/98  soft-tissue]
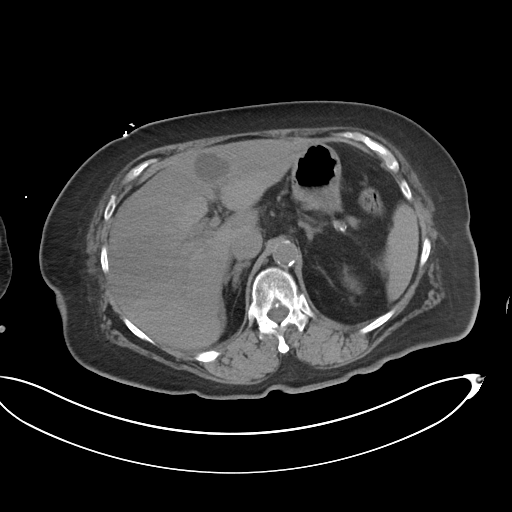
[im 71/98  bone]
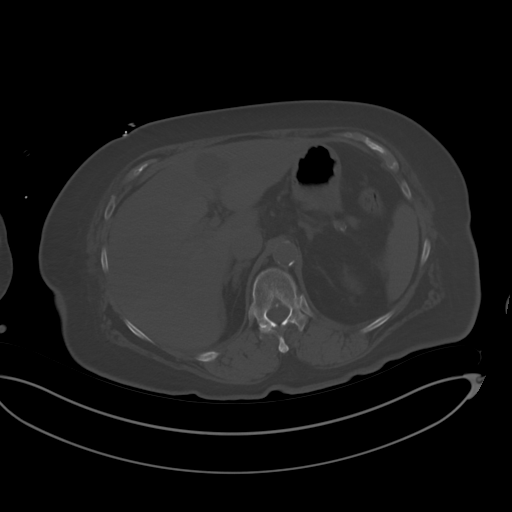
[im 76/98  soft-tissue]
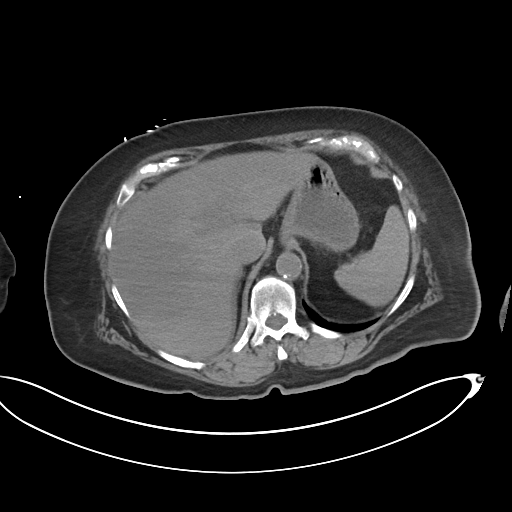
[im 81/98  soft-tissue]
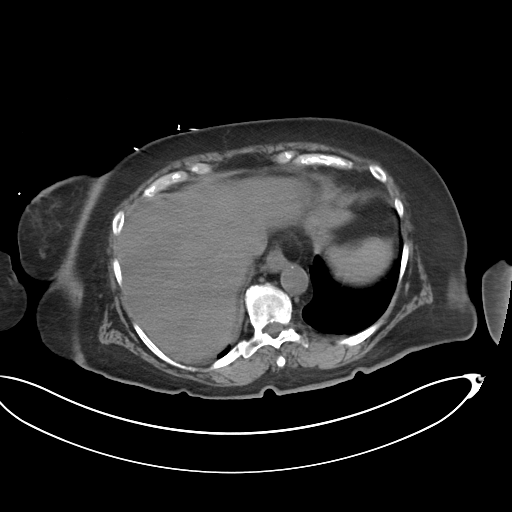
[im 92/98  soft-tissue]
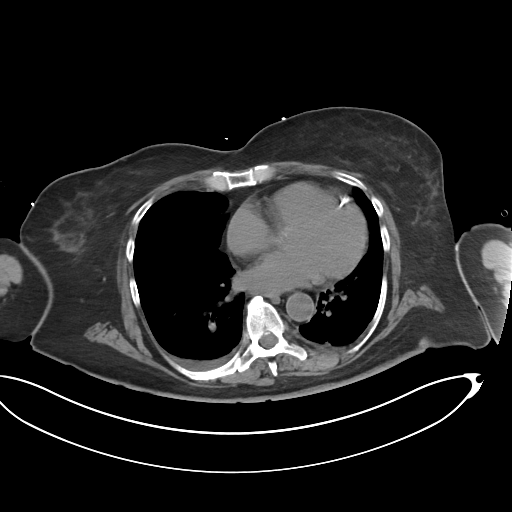

[Series 6: cor · coronal · 0.90mm/px · 3 of 92 slices shown]
[im 31/92  soft-tissue]
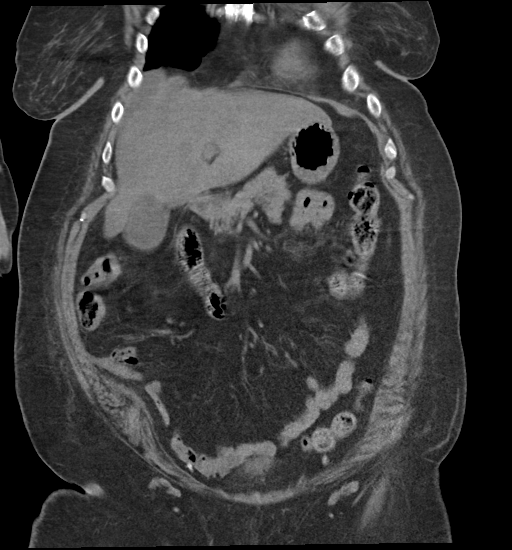
[im 41/92  soft-tissue]
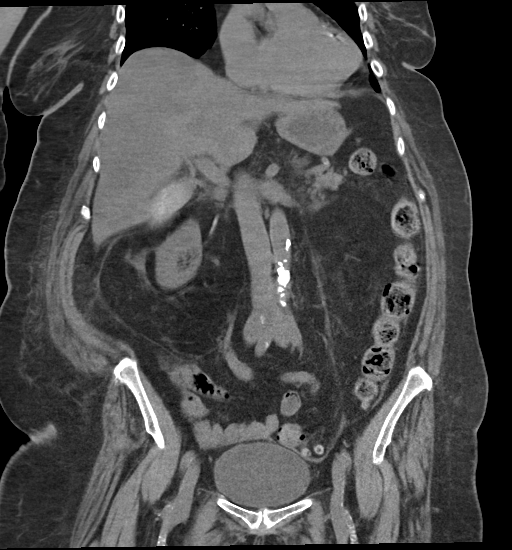
[im 51/92  soft-tissue]
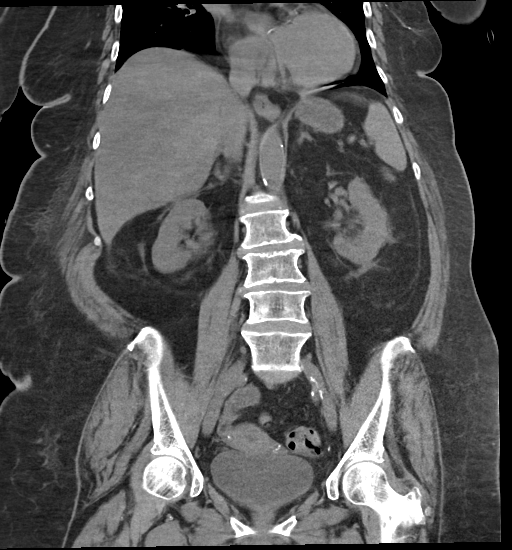

[15 of 46 positions shown; findings below may reference images not displayed]

FINDINGS: Lower chest: Small right pleural effusion lying dependently.
Atherosclerotic calcifications in the descending thoracic aorta as
well as the left anterior descending, left circumflex and right
coronary arteries. Calcifications of the aortic valve.

Hepatobiliary: Heterogeneous areas of low attenuation are noted
throughout the hepatic parenchyma, indicative of heterogeneous
hepatic steatosis. Large 3.7 x 3.1 cm low-attenuation lesion in the
left lobe of the liver, previously characterized as a simple cyst.
No other definite suspicious hepatic lesions are noted on today's
noncontrast CT examination. Amorphous high attenuation material
lying dependently in the gallbladder which may reflect biliary
sludge and/or innumerable tiny calcified gallstones. Gallbladder is
not distended. No gallbladder wall thickening. Trace amount of
pericholecystic fluid, without overt surrounding inflammatory
changes.

Pancreas: No definite pancreatic mass or peripancreatic fluid
collections or inflammatory changes noted on today's noncontrast CT
examination.

Spleen: Unremarkable.

Adrenals/Urinary Tract: Unenhanced appearance of the kidneys and
bilateral adrenal glands is unremarkable. No hydroureteronephrosis.
Urinary bladder is normal in appearance.

Stomach/Bowel: Unenhanced appearance of the stomach is normal. No
pathologic dilatation of small bowel or colon. Numerous colonic
diverticulae are noted, without surrounding inflammatory changes to
suggest an acute diverticulitis at this time. The appendix is not
confidently identified and may be surgically absent. Regardless,
there are no inflammatory changes noted adjacent to the cecum to
suggest the presence of an acute appendicitis at this time.

Vascular/Lymphatic: Aortic atherosclerosis. No lymphadenopathy noted
in the abdomen or pelvis.

Reproductive: Uterus and ovaries are unremarkable in appearance.

Other: Trace volume of ascites.  No pneumoperitoneum.

Musculoskeletal: No aggressive appearing lytic or blastic lesions
are noted in the visualized portions of the skeleton.
IMPRESSION: 1. Biliary sludge and/or tiny calcified gallstones lying dependently
in the gallbladder. Gallbladder is not distended and there is no
gallbladder wall thickening. There is a trace amount of
pericholecystic fluid, however, there are no overt surrounding
inflammatory changes. If there is clinical concern for acute
cholecystitis, further evaluation with right upper quadrant
ultrasound should be considered.
2. Trace volume of ascites.
3. No other acute findings are noted in the abdomen or pelvis.
4. Colonic diverticulosis without evidence of acute diverticulitis
at this time.
5. Small right pleural effusion lying dependently.
6. Aortic atherosclerosis, in addition to 3 vessel coronary artery
disease. Assessment for potential risk factor modification, dietary
therapy or pharmacologic therapy may be warranted, if clinically
indicated.
7. There are calcifications of the aortic valve. Echocardiographic
correlation for evaluation of potential valvular dysfunction may be
warranted if clinically indicated.
8. Additional incidental findings, as above.

## 2022-06-09 DIAGNOSIS — M62838 Other muscle spasm: Secondary | ICD-10-CM | POA: Diagnosis not present

## 2022-06-09 DIAGNOSIS — R532 Functional quadriplegia: Secondary | ICD-10-CM | POA: Diagnosis not present

## 2022-06-09 DIAGNOSIS — R052 Subacute cough: Secondary | ICD-10-CM | POA: Diagnosis not present

## 2022-06-09 DIAGNOSIS — R1310 Dysphagia, unspecified: Secondary | ICD-10-CM | POA: Diagnosis not present

## 2022-06-09 DIAGNOSIS — R0602 Shortness of breath: Secondary | ICD-10-CM | POA: Diagnosis not present

## 2022-06-09 DIAGNOSIS — I509 Heart failure, unspecified: Secondary | ICD-10-CM | POA: Diagnosis not present

## 2022-06-10 DIAGNOSIS — R0602 Shortness of breath: Secondary | ICD-10-CM | POA: Diagnosis not present

## 2022-06-10 DIAGNOSIS — I509 Heart failure, unspecified: Secondary | ICD-10-CM | POA: Diagnosis not present

## 2022-06-10 DIAGNOSIS — R532 Functional quadriplegia: Secondary | ICD-10-CM | POA: Diagnosis not present

## 2022-06-10 DIAGNOSIS — R052 Subacute cough: Secondary | ICD-10-CM | POA: Diagnosis not present

## 2022-06-10 DIAGNOSIS — R1310 Dysphagia, unspecified: Secondary | ICD-10-CM | POA: Diagnosis not present

## 2022-06-10 DIAGNOSIS — M62838 Other muscle spasm: Secondary | ICD-10-CM | POA: Diagnosis not present

## 2022-06-13 DIAGNOSIS — R052 Subacute cough: Secondary | ICD-10-CM | POA: Diagnosis not present

## 2022-06-13 DIAGNOSIS — R1310 Dysphagia, unspecified: Secondary | ICD-10-CM | POA: Diagnosis not present

## 2022-06-13 DIAGNOSIS — I509 Heart failure, unspecified: Secondary | ICD-10-CM | POA: Diagnosis not present

## 2022-06-13 DIAGNOSIS — M62838 Other muscle spasm: Secondary | ICD-10-CM | POA: Diagnosis not present

## 2022-06-13 DIAGNOSIS — R0602 Shortness of breath: Secondary | ICD-10-CM | POA: Diagnosis not present

## 2022-06-13 DIAGNOSIS — R532 Functional quadriplegia: Secondary | ICD-10-CM | POA: Diagnosis not present

## 2022-06-14 DIAGNOSIS — R532 Functional quadriplegia: Secondary | ICD-10-CM | POA: Diagnosis not present

## 2022-06-14 DIAGNOSIS — R1310 Dysphagia, unspecified: Secondary | ICD-10-CM | POA: Diagnosis not present

## 2022-06-14 DIAGNOSIS — R0602 Shortness of breath: Secondary | ICD-10-CM | POA: Diagnosis not present

## 2022-06-14 DIAGNOSIS — R052 Subacute cough: Secondary | ICD-10-CM | POA: Diagnosis not present

## 2022-06-14 DIAGNOSIS — I509 Heart failure, unspecified: Secondary | ICD-10-CM | POA: Diagnosis not present

## 2022-06-14 DIAGNOSIS — M62838 Other muscle spasm: Secondary | ICD-10-CM | POA: Diagnosis not present

## 2022-06-15 DIAGNOSIS — R052 Subacute cough: Secondary | ICD-10-CM | POA: Diagnosis not present

## 2022-06-15 DIAGNOSIS — R1310 Dysphagia, unspecified: Secondary | ICD-10-CM | POA: Diagnosis not present

## 2022-06-15 DIAGNOSIS — I509 Heart failure, unspecified: Secondary | ICD-10-CM | POA: Diagnosis not present

## 2022-06-15 DIAGNOSIS — R0602 Shortness of breath: Secondary | ICD-10-CM | POA: Diagnosis not present

## 2022-06-15 DIAGNOSIS — M62838 Other muscle spasm: Secondary | ICD-10-CM | POA: Diagnosis not present

## 2022-06-15 DIAGNOSIS — F32A Depression, unspecified: Secondary | ICD-10-CM | POA: Diagnosis not present

## 2022-06-15 DIAGNOSIS — R532 Functional quadriplegia: Secondary | ICD-10-CM | POA: Diagnosis not present

## 2022-06-16 DIAGNOSIS — R0602 Shortness of breath: Secondary | ICD-10-CM | POA: Diagnosis not present

## 2022-06-16 DIAGNOSIS — I509 Heart failure, unspecified: Secondary | ICD-10-CM | POA: Diagnosis not present

## 2022-06-16 DIAGNOSIS — M62838 Other muscle spasm: Secondary | ICD-10-CM | POA: Diagnosis not present

## 2022-06-16 DIAGNOSIS — R1310 Dysphagia, unspecified: Secondary | ICD-10-CM | POA: Diagnosis not present

## 2022-06-16 DIAGNOSIS — R532 Functional quadriplegia: Secondary | ICD-10-CM | POA: Diagnosis not present

## 2022-06-16 DIAGNOSIS — R052 Subacute cough: Secondary | ICD-10-CM | POA: Diagnosis not present

## 2022-06-17 DIAGNOSIS — I509 Heart failure, unspecified: Secondary | ICD-10-CM | POA: Diagnosis not present

## 2022-06-17 DIAGNOSIS — R052 Subacute cough: Secondary | ICD-10-CM | POA: Diagnosis not present

## 2022-06-17 DIAGNOSIS — M62838 Other muscle spasm: Secondary | ICD-10-CM | POA: Diagnosis not present

## 2022-06-17 DIAGNOSIS — R532 Functional quadriplegia: Secondary | ICD-10-CM | POA: Diagnosis not present

## 2022-06-17 DIAGNOSIS — R1310 Dysphagia, unspecified: Secondary | ICD-10-CM | POA: Diagnosis not present

## 2022-06-17 DIAGNOSIS — R0602 Shortness of breath: Secondary | ICD-10-CM | POA: Diagnosis not present

## 2022-06-20 DIAGNOSIS — R1310 Dysphagia, unspecified: Secondary | ICD-10-CM | POA: Diagnosis not present

## 2022-06-20 DIAGNOSIS — R296 Repeated falls: Secondary | ICD-10-CM | POA: Diagnosis not present

## 2022-06-20 DIAGNOSIS — R2681 Unsteadiness on feet: Secondary | ICD-10-CM | POA: Diagnosis not present

## 2022-06-20 DIAGNOSIS — I509 Heart failure, unspecified: Secondary | ICD-10-CM | POA: Diagnosis not present

## 2022-06-20 DIAGNOSIS — R627 Adult failure to thrive: Secondary | ICD-10-CM | POA: Diagnosis not present

## 2022-06-20 DIAGNOSIS — M6281 Muscle weakness (generalized): Secondary | ICD-10-CM | POA: Diagnosis not present

## 2022-06-21 DIAGNOSIS — R296 Repeated falls: Secondary | ICD-10-CM | POA: Diagnosis not present

## 2022-06-21 DIAGNOSIS — R627 Adult failure to thrive: Secondary | ICD-10-CM | POA: Diagnosis not present

## 2022-06-21 DIAGNOSIS — R1310 Dysphagia, unspecified: Secondary | ICD-10-CM | POA: Diagnosis not present

## 2022-06-21 DIAGNOSIS — M6281 Muscle weakness (generalized): Secondary | ICD-10-CM | POA: Diagnosis not present

## 2022-06-21 DIAGNOSIS — I509 Heart failure, unspecified: Secondary | ICD-10-CM | POA: Diagnosis not present

## 2022-06-21 DIAGNOSIS — R2681 Unsteadiness on feet: Secondary | ICD-10-CM | POA: Diagnosis not present

## 2022-06-22 DIAGNOSIS — I509 Heart failure, unspecified: Secondary | ICD-10-CM | POA: Diagnosis not present

## 2022-06-22 DIAGNOSIS — R627 Adult failure to thrive: Secondary | ICD-10-CM | POA: Diagnosis not present

## 2022-06-22 DIAGNOSIS — M6281 Muscle weakness (generalized): Secondary | ICD-10-CM | POA: Diagnosis not present

## 2022-06-22 DIAGNOSIS — R2681 Unsteadiness on feet: Secondary | ICD-10-CM | POA: Diagnosis not present

## 2022-06-22 DIAGNOSIS — R1310 Dysphagia, unspecified: Secondary | ICD-10-CM | POA: Diagnosis not present

## 2022-06-22 DIAGNOSIS — R296 Repeated falls: Secondary | ICD-10-CM | POA: Diagnosis not present

## 2022-06-23 DIAGNOSIS — I509 Heart failure, unspecified: Secondary | ICD-10-CM | POA: Diagnosis not present

## 2022-06-23 DIAGNOSIS — R627 Adult failure to thrive: Secondary | ICD-10-CM | POA: Diagnosis not present

## 2022-06-23 DIAGNOSIS — R1310 Dysphagia, unspecified: Secondary | ICD-10-CM | POA: Diagnosis not present

## 2022-06-23 DIAGNOSIS — M6281 Muscle weakness (generalized): Secondary | ICD-10-CM | POA: Diagnosis not present

## 2022-06-23 DIAGNOSIS — R2681 Unsteadiness on feet: Secondary | ICD-10-CM | POA: Diagnosis not present

## 2022-06-23 DIAGNOSIS — R296 Repeated falls: Secondary | ICD-10-CM | POA: Diagnosis not present

## 2022-06-24 DIAGNOSIS — R2681 Unsteadiness on feet: Secondary | ICD-10-CM | POA: Diagnosis not present

## 2022-06-24 DIAGNOSIS — R1310 Dysphagia, unspecified: Secondary | ICD-10-CM | POA: Diagnosis not present

## 2022-06-24 DIAGNOSIS — R296 Repeated falls: Secondary | ICD-10-CM | POA: Diagnosis not present

## 2022-06-24 DIAGNOSIS — I509 Heart failure, unspecified: Secondary | ICD-10-CM | POA: Diagnosis not present

## 2022-06-24 DIAGNOSIS — R627 Adult failure to thrive: Secondary | ICD-10-CM | POA: Diagnosis not present

## 2022-06-24 DIAGNOSIS — M6281 Muscle weakness (generalized): Secondary | ICD-10-CM | POA: Diagnosis not present

## 2022-06-27 DIAGNOSIS — R1310 Dysphagia, unspecified: Secondary | ICD-10-CM | POA: Diagnosis not present

## 2022-06-27 DIAGNOSIS — M6281 Muscle weakness (generalized): Secondary | ICD-10-CM | POA: Diagnosis not present

## 2022-06-27 DIAGNOSIS — R296 Repeated falls: Secondary | ICD-10-CM | POA: Diagnosis not present

## 2022-06-27 DIAGNOSIS — R2681 Unsteadiness on feet: Secondary | ICD-10-CM | POA: Diagnosis not present

## 2022-06-27 DIAGNOSIS — R627 Adult failure to thrive: Secondary | ICD-10-CM | POA: Diagnosis not present

## 2022-06-27 DIAGNOSIS — I509 Heart failure, unspecified: Secondary | ICD-10-CM | POA: Diagnosis not present

## 2022-06-28 DIAGNOSIS — I2510 Atherosclerotic heart disease of native coronary artery without angina pectoris: Secondary | ICD-10-CM | POA: Diagnosis not present

## 2022-06-28 DIAGNOSIS — I509 Heart failure, unspecified: Secondary | ICD-10-CM | POA: Diagnosis not present

## 2022-06-28 DIAGNOSIS — I10 Essential (primary) hypertension: Secondary | ICD-10-CM | POA: Diagnosis not present

## 2022-06-28 DIAGNOSIS — M6281 Muscle weakness (generalized): Secondary | ICD-10-CM | POA: Diagnosis not present

## 2022-06-28 DIAGNOSIS — R296 Repeated falls: Secondary | ICD-10-CM | POA: Diagnosis not present

## 2022-06-28 DIAGNOSIS — R5381 Other malaise: Secondary | ICD-10-CM | POA: Diagnosis not present

## 2022-06-28 DIAGNOSIS — R1310 Dysphagia, unspecified: Secondary | ICD-10-CM | POA: Diagnosis not present

## 2022-06-28 DIAGNOSIS — R2681 Unsteadiness on feet: Secondary | ICD-10-CM | POA: Diagnosis not present

## 2022-06-28 DIAGNOSIS — E119 Type 2 diabetes mellitus without complications: Secondary | ICD-10-CM | POA: Diagnosis not present

## 2022-06-28 DIAGNOSIS — R627 Adult failure to thrive: Secondary | ICD-10-CM | POA: Diagnosis not present

## 2022-06-29 DIAGNOSIS — I509 Heart failure, unspecified: Secondary | ICD-10-CM | POA: Diagnosis not present

## 2022-06-29 DIAGNOSIS — R1310 Dysphagia, unspecified: Secondary | ICD-10-CM | POA: Diagnosis not present

## 2022-06-29 DIAGNOSIS — R296 Repeated falls: Secondary | ICD-10-CM | POA: Diagnosis not present

## 2022-06-29 DIAGNOSIS — M6281 Muscle weakness (generalized): Secondary | ICD-10-CM | POA: Diagnosis not present

## 2022-06-29 DIAGNOSIS — R627 Adult failure to thrive: Secondary | ICD-10-CM | POA: Diagnosis not present

## 2022-06-29 DIAGNOSIS — R2681 Unsteadiness on feet: Secondary | ICD-10-CM | POA: Diagnosis not present

## 2022-07-14 DIAGNOSIS — B351 Tinea unguium: Secondary | ICD-10-CM | POA: Diagnosis not present

## 2022-07-14 DIAGNOSIS — E1159 Type 2 diabetes mellitus with other circulatory complications: Secondary | ICD-10-CM | POA: Diagnosis not present

## 2022-07-15 DIAGNOSIS — I2510 Atherosclerotic heart disease of native coronary artery without angina pectoris: Secondary | ICD-10-CM | POA: Diagnosis not present

## 2022-07-15 DIAGNOSIS — E1165 Type 2 diabetes mellitus with hyperglycemia: Secondary | ICD-10-CM | POA: Diagnosis not present

## 2022-07-15 DIAGNOSIS — E119 Type 2 diabetes mellitus without complications: Secondary | ICD-10-CM | POA: Diagnosis not present

## 2022-07-15 DIAGNOSIS — E785 Hyperlipidemia, unspecified: Secondary | ICD-10-CM | POA: Diagnosis not present

## 2022-07-15 DIAGNOSIS — I214 Non-ST elevation (NSTEMI) myocardial infarction: Secondary | ICD-10-CM | POA: Diagnosis not present

## 2022-07-15 DIAGNOSIS — I10 Essential (primary) hypertension: Secondary | ICD-10-CM | POA: Diagnosis not present

## 2022-07-23 DIAGNOSIS — E785 Hyperlipidemia, unspecified: Secondary | ICD-10-CM | POA: Diagnosis not present

## 2022-07-23 DIAGNOSIS — I2510 Atherosclerotic heart disease of native coronary artery without angina pectoris: Secondary | ICD-10-CM | POA: Diagnosis not present

## 2022-07-23 DIAGNOSIS — I10 Essential (primary) hypertension: Secondary | ICD-10-CM | POA: Diagnosis not present

## 2022-07-23 DIAGNOSIS — E119 Type 2 diabetes mellitus without complications: Secondary | ICD-10-CM | POA: Diagnosis not present

## 2022-07-23 DIAGNOSIS — I214 Non-ST elevation (NSTEMI) myocardial infarction: Secondary | ICD-10-CM | POA: Diagnosis not present

## 2022-07-23 DIAGNOSIS — E1165 Type 2 diabetes mellitus with hyperglycemia: Secondary | ICD-10-CM | POA: Diagnosis not present

## 2022-07-26 DIAGNOSIS — R5381 Other malaise: Secondary | ICD-10-CM | POA: Diagnosis not present

## 2022-07-26 DIAGNOSIS — E119 Type 2 diabetes mellitus without complications: Secondary | ICD-10-CM | POA: Diagnosis not present

## 2022-07-26 DIAGNOSIS — I2510 Atherosclerotic heart disease of native coronary artery without angina pectoris: Secondary | ICD-10-CM | POA: Diagnosis not present

## 2022-07-26 DIAGNOSIS — I10 Essential (primary) hypertension: Secondary | ICD-10-CM | POA: Diagnosis not present

## 2022-07-26 DIAGNOSIS — I509 Heart failure, unspecified: Secondary | ICD-10-CM | POA: Diagnosis not present

## 2022-07-27 DIAGNOSIS — F5105 Insomnia due to other mental disorder: Secondary | ICD-10-CM | POA: Diagnosis not present

## 2022-07-27 DIAGNOSIS — F32A Depression, unspecified: Secondary | ICD-10-CM | POA: Diagnosis not present

## 2022-08-23 DIAGNOSIS — D649 Anemia, unspecified: Secondary | ICD-10-CM | POA: Diagnosis not present

## 2022-08-23 DIAGNOSIS — E119 Type 2 diabetes mellitus without complications: Secondary | ICD-10-CM | POA: Diagnosis not present

## 2022-08-23 DIAGNOSIS — E1165 Type 2 diabetes mellitus with hyperglycemia: Secondary | ICD-10-CM | POA: Diagnosis not present

## 2022-08-23 DIAGNOSIS — N390 Urinary tract infection, site not specified: Secondary | ICD-10-CM | POA: Diagnosis not present

## 2022-08-23 DIAGNOSIS — R5381 Other malaise: Secondary | ICD-10-CM | POA: Diagnosis not present

## 2022-08-23 DIAGNOSIS — I10 Essential (primary) hypertension: Secondary | ICD-10-CM | POA: Diagnosis not present

## 2022-08-23 DIAGNOSIS — E785 Hyperlipidemia, unspecified: Secondary | ICD-10-CM | POA: Diagnosis not present

## 2022-08-23 DIAGNOSIS — I2510 Atherosclerotic heart disease of native coronary artery without angina pectoris: Secondary | ICD-10-CM | POA: Diagnosis not present

## 2022-08-23 DIAGNOSIS — I214 Non-ST elevation (NSTEMI) myocardial infarction: Secondary | ICD-10-CM | POA: Diagnosis not present

## 2022-08-23 DIAGNOSIS — I509 Heart failure, unspecified: Secondary | ICD-10-CM | POA: Diagnosis not present

## 2022-08-26 DIAGNOSIS — N390 Urinary tract infection, site not specified: Secondary | ICD-10-CM | POA: Diagnosis not present

## 2022-09-01 DIAGNOSIS — I2510 Atherosclerotic heart disease of native coronary artery without angina pectoris: Secondary | ICD-10-CM | POA: Diagnosis present

## 2022-09-01 DIAGNOSIS — G9341 Metabolic encephalopathy: Secondary | ICD-10-CM | POA: Diagnosis not present

## 2022-09-01 DIAGNOSIS — N179 Acute kidney failure, unspecified: Secondary | ICD-10-CM | POA: Diagnosis not present

## 2022-09-01 DIAGNOSIS — N390 Urinary tract infection, site not specified: Secondary | ICD-10-CM | POA: Diagnosis not present

## 2022-09-01 DIAGNOSIS — R410 Disorientation, unspecified: Secondary | ICD-10-CM | POA: Diagnosis not present

## 2022-09-01 DIAGNOSIS — F5105 Insomnia due to other mental disorder: Secondary | ICD-10-CM | POA: Diagnosis not present

## 2022-09-01 DIAGNOSIS — I447 Left bundle-branch block, unspecified: Secondary | ICD-10-CM | POA: Diagnosis present

## 2022-09-01 DIAGNOSIS — E1169 Type 2 diabetes mellitus with other specified complication: Secondary | ICD-10-CM | POA: Diagnosis present

## 2022-09-01 DIAGNOSIS — R110 Nausea: Secondary | ICD-10-CM | POA: Diagnosis not present

## 2022-09-01 DIAGNOSIS — Z8679 Personal history of other diseases of the circulatory system: Secondary | ICD-10-CM | POA: Diagnosis not present

## 2022-09-01 DIAGNOSIS — E871 Hypo-osmolality and hyponatremia: Secondary | ICD-10-CM | POA: Diagnosis present

## 2022-09-01 DIAGNOSIS — Z7982 Long term (current) use of aspirin: Secondary | ICD-10-CM | POA: Diagnosis not present

## 2022-09-01 DIAGNOSIS — E785 Hyperlipidemia, unspecified: Secondary | ICD-10-CM | POA: Diagnosis present

## 2022-09-01 DIAGNOSIS — I110 Hypertensive heart disease with heart failure: Secondary | ICD-10-CM | POA: Diagnosis present

## 2022-09-01 DIAGNOSIS — Z743 Need for continuous supervision: Secondary | ICD-10-CM | POA: Diagnosis not present

## 2022-09-01 DIAGNOSIS — R531 Weakness: Secondary | ICD-10-CM | POA: Diagnosis not present

## 2022-09-01 DIAGNOSIS — Z8659 Personal history of other mental and behavioral disorders: Secondary | ICD-10-CM | POA: Diagnosis not present

## 2022-09-01 DIAGNOSIS — J9811 Atelectasis: Secondary | ICD-10-CM | POA: Diagnosis present

## 2022-09-01 DIAGNOSIS — I5022 Chronic systolic (congestive) heart failure: Secondary | ICD-10-CM | POA: Diagnosis present

## 2022-09-01 DIAGNOSIS — K219 Gastro-esophageal reflux disease without esophagitis: Secondary | ICD-10-CM | POA: Diagnosis present

## 2022-09-01 DIAGNOSIS — Z7984 Long term (current) use of oral hypoglycemic drugs: Secondary | ICD-10-CM | POA: Diagnosis not present

## 2022-09-01 DIAGNOSIS — F419 Anxiety disorder, unspecified: Secondary | ICD-10-CM | POA: Diagnosis present

## 2022-09-01 DIAGNOSIS — J479 Bronchiectasis, uncomplicated: Secondary | ICD-10-CM | POA: Diagnosis not present

## 2022-09-01 DIAGNOSIS — Z20822 Contact with and (suspected) exposure to covid-19: Secondary | ICD-10-CM | POA: Diagnosis not present

## 2022-09-01 DIAGNOSIS — F05 Delirium due to known physiological condition: Secondary | ICD-10-CM | POA: Diagnosis present

## 2022-09-01 DIAGNOSIS — E860 Dehydration: Secondary | ICD-10-CM | POA: Diagnosis not present

## 2022-09-01 DIAGNOSIS — J189 Pneumonia, unspecified organism: Secondary | ICD-10-CM | POA: Diagnosis not present

## 2022-09-01 DIAGNOSIS — Z794 Long term (current) use of insulin: Secondary | ICD-10-CM | POA: Diagnosis not present

## 2022-09-01 DIAGNOSIS — F600 Paranoid personality disorder: Secondary | ICD-10-CM | POA: Diagnosis not present

## 2022-09-01 DIAGNOSIS — I959 Hypotension, unspecified: Secondary | ICD-10-CM | POA: Diagnosis not present

## 2022-09-01 DIAGNOSIS — F32A Depression, unspecified: Secondary | ICD-10-CM | POA: Diagnosis not present

## 2022-09-01 DIAGNOSIS — Z79899 Other long term (current) drug therapy: Secondary | ICD-10-CM | POA: Diagnosis not present

## 2022-09-01 DIAGNOSIS — E11649 Type 2 diabetes mellitus with hypoglycemia without coma: Secondary | ICD-10-CM | POA: Diagnosis present

## 2022-09-06 DIAGNOSIS — R2681 Unsteadiness on feet: Secondary | ICD-10-CM | POA: Diagnosis not present

## 2022-09-06 DIAGNOSIS — Z9181 History of falling: Secondary | ICD-10-CM | POA: Diagnosis not present

## 2022-09-06 DIAGNOSIS — M6281 Muscle weakness (generalized): Secondary | ICD-10-CM | POA: Diagnosis not present

## 2022-09-06 DIAGNOSIS — I509 Heart failure, unspecified: Secondary | ICD-10-CM | POA: Diagnosis not present

## 2022-09-06 DIAGNOSIS — I482 Chronic atrial fibrillation, unspecified: Secondary | ICD-10-CM | POA: Diagnosis not present

## 2022-09-07 DIAGNOSIS — Z9181 History of falling: Secondary | ICD-10-CM | POA: Diagnosis not present

## 2022-09-07 DIAGNOSIS — I429 Cardiomyopathy, unspecified: Secondary | ICD-10-CM | POA: Diagnosis not present

## 2022-09-07 DIAGNOSIS — N179 Acute kidney failure, unspecified: Secondary | ICD-10-CM | POA: Diagnosis not present

## 2022-09-07 DIAGNOSIS — I10 Essential (primary) hypertension: Secondary | ICD-10-CM | POA: Diagnosis not present

## 2022-09-07 DIAGNOSIS — I482 Chronic atrial fibrillation, unspecified: Secondary | ICD-10-CM | POA: Diagnosis not present

## 2022-09-07 DIAGNOSIS — R2681 Unsteadiness on feet: Secondary | ICD-10-CM | POA: Diagnosis not present

## 2022-09-07 DIAGNOSIS — F32A Depression, unspecified: Secondary | ICD-10-CM | POA: Diagnosis not present

## 2022-09-07 DIAGNOSIS — M6281 Muscle weakness (generalized): Secondary | ICD-10-CM | POA: Diagnosis not present

## 2022-09-07 DIAGNOSIS — F5105 Insomnia due to other mental disorder: Secondary | ICD-10-CM | POA: Diagnosis not present

## 2022-09-07 DIAGNOSIS — G9341 Metabolic encephalopathy: Secondary | ICD-10-CM | POA: Diagnosis not present

## 2022-09-07 DIAGNOSIS — J189 Pneumonia, unspecified organism: Secondary | ICD-10-CM | POA: Diagnosis not present

## 2022-09-07 DIAGNOSIS — I509 Heart failure, unspecified: Secondary | ICD-10-CM | POA: Diagnosis not present

## 2022-09-08 DIAGNOSIS — I509 Heart failure, unspecified: Secondary | ICD-10-CM | POA: Diagnosis not present

## 2022-09-08 DIAGNOSIS — Z9181 History of falling: Secondary | ICD-10-CM | POA: Diagnosis not present

## 2022-09-08 DIAGNOSIS — D649 Anemia, unspecified: Secondary | ICD-10-CM | POA: Diagnosis not present

## 2022-09-08 DIAGNOSIS — I482 Chronic atrial fibrillation, unspecified: Secondary | ICD-10-CM | POA: Diagnosis not present

## 2022-09-08 DIAGNOSIS — M6281 Muscle weakness (generalized): Secondary | ICD-10-CM | POA: Diagnosis not present

## 2022-09-08 DIAGNOSIS — R2681 Unsteadiness on feet: Secondary | ICD-10-CM | POA: Diagnosis not present

## 2022-09-09 DIAGNOSIS — Z9181 History of falling: Secondary | ICD-10-CM | POA: Diagnosis not present

## 2022-09-09 DIAGNOSIS — I482 Chronic atrial fibrillation, unspecified: Secondary | ICD-10-CM | POA: Diagnosis not present

## 2022-09-09 DIAGNOSIS — M6281 Muscle weakness (generalized): Secondary | ICD-10-CM | POA: Diagnosis not present

## 2022-09-09 DIAGNOSIS — R2681 Unsteadiness on feet: Secondary | ICD-10-CM | POA: Diagnosis not present

## 2022-09-09 DIAGNOSIS — I509 Heart failure, unspecified: Secondary | ICD-10-CM | POA: Diagnosis not present

## 2022-09-10 DIAGNOSIS — M6281 Muscle weakness (generalized): Secondary | ICD-10-CM | POA: Diagnosis not present

## 2022-09-10 DIAGNOSIS — I482 Chronic atrial fibrillation, unspecified: Secondary | ICD-10-CM | POA: Diagnosis not present

## 2022-09-10 DIAGNOSIS — Z9181 History of falling: Secondary | ICD-10-CM | POA: Diagnosis not present

## 2022-09-10 DIAGNOSIS — I509 Heart failure, unspecified: Secondary | ICD-10-CM | POA: Diagnosis not present

## 2022-09-10 DIAGNOSIS — R2681 Unsteadiness on feet: Secondary | ICD-10-CM | POA: Diagnosis not present

## 2022-09-13 DIAGNOSIS — Z9181 History of falling: Secondary | ICD-10-CM | POA: Diagnosis not present

## 2022-09-13 DIAGNOSIS — M6281 Muscle weakness (generalized): Secondary | ICD-10-CM | POA: Diagnosis not present

## 2022-09-13 DIAGNOSIS — I509 Heart failure, unspecified: Secondary | ICD-10-CM | POA: Diagnosis not present

## 2022-09-13 DIAGNOSIS — I482 Chronic atrial fibrillation, unspecified: Secondary | ICD-10-CM | POA: Diagnosis not present

## 2022-09-13 DIAGNOSIS — R2681 Unsteadiness on feet: Secondary | ICD-10-CM | POA: Diagnosis not present

## 2022-09-14 DIAGNOSIS — I482 Chronic atrial fibrillation, unspecified: Secondary | ICD-10-CM | POA: Diagnosis not present

## 2022-09-14 DIAGNOSIS — Z9181 History of falling: Secondary | ICD-10-CM | POA: Diagnosis not present

## 2022-09-14 DIAGNOSIS — R2681 Unsteadiness on feet: Secondary | ICD-10-CM | POA: Diagnosis not present

## 2022-09-14 DIAGNOSIS — M6281 Muscle weakness (generalized): Secondary | ICD-10-CM | POA: Diagnosis not present

## 2022-09-14 DIAGNOSIS — I509 Heart failure, unspecified: Secondary | ICD-10-CM | POA: Diagnosis not present

## 2022-09-15 DIAGNOSIS — I509 Heart failure, unspecified: Secondary | ICD-10-CM | POA: Diagnosis not present

## 2022-09-15 DIAGNOSIS — Z9181 History of falling: Secondary | ICD-10-CM | POA: Diagnosis not present

## 2022-09-15 DIAGNOSIS — I482 Chronic atrial fibrillation, unspecified: Secondary | ICD-10-CM | POA: Diagnosis not present

## 2022-09-15 DIAGNOSIS — M6281 Muscle weakness (generalized): Secondary | ICD-10-CM | POA: Diagnosis not present

## 2022-09-15 DIAGNOSIS — R2681 Unsteadiness on feet: Secondary | ICD-10-CM | POA: Diagnosis not present

## 2022-09-16 DIAGNOSIS — I482 Chronic atrial fibrillation, unspecified: Secondary | ICD-10-CM | POA: Diagnosis not present

## 2022-09-16 DIAGNOSIS — Z9181 History of falling: Secondary | ICD-10-CM | POA: Diagnosis not present

## 2022-09-16 DIAGNOSIS — M6281 Muscle weakness (generalized): Secondary | ICD-10-CM | POA: Diagnosis not present

## 2022-09-16 DIAGNOSIS — R2681 Unsteadiness on feet: Secondary | ICD-10-CM | POA: Diagnosis not present

## 2022-09-16 DIAGNOSIS — I509 Heart failure, unspecified: Secondary | ICD-10-CM | POA: Diagnosis not present

## 2022-09-23 DIAGNOSIS — J9811 Atelectasis: Secondary | ICD-10-CM | POA: Diagnosis not present

## 2022-09-23 DIAGNOSIS — I2510 Atherosclerotic heart disease of native coronary artery without angina pectoris: Secondary | ICD-10-CM | POA: Diagnosis not present

## 2022-09-23 DIAGNOSIS — R188 Other ascites: Secondary | ICD-10-CM | POA: Diagnosis not present

## 2022-09-23 DIAGNOSIS — Z20822 Contact with and (suspected) exposure to covid-19: Secondary | ICD-10-CM | POA: Diagnosis not present

## 2022-09-23 DIAGNOSIS — R6521 Severe sepsis with septic shock: Secondary | ICD-10-CM | POA: Diagnosis not present

## 2022-09-23 DIAGNOSIS — I5020 Unspecified systolic (congestive) heart failure: Secondary | ICD-10-CM | POA: Diagnosis not present

## 2022-09-23 DIAGNOSIS — I6782 Cerebral ischemia: Secondary | ICD-10-CM | POA: Diagnosis not present

## 2022-09-23 DIAGNOSIS — Z66 Do not resuscitate: Secondary | ICD-10-CM | POA: Diagnosis not present

## 2022-09-23 DIAGNOSIS — A419 Sepsis, unspecified organism: Secondary | ICD-10-CM | POA: Diagnosis not present

## 2022-09-23 DIAGNOSIS — I509 Heart failure, unspecified: Secondary | ICD-10-CM | POA: Diagnosis not present

## 2022-09-23 DIAGNOSIS — I110 Hypertensive heart disease with heart failure: Secondary | ICD-10-CM | POA: Diagnosis not present

## 2022-09-23 DIAGNOSIS — E119 Type 2 diabetes mellitus without complications: Secondary | ICD-10-CM | POA: Diagnosis not present

## 2022-09-23 DIAGNOSIS — J90 Pleural effusion, not elsewhere classified: Secondary | ICD-10-CM | POA: Diagnosis not present

## 2022-09-23 DIAGNOSIS — I6523 Occlusion and stenosis of bilateral carotid arteries: Secondary | ICD-10-CM | POA: Diagnosis not present

## 2022-09-23 DIAGNOSIS — K8020 Calculus of gallbladder without cholecystitis without obstruction: Secondary | ICD-10-CM | POA: Diagnosis not present

## 2022-09-23 DIAGNOSIS — D696 Thrombocytopenia, unspecified: Secondary | ICD-10-CM | POA: Diagnosis not present

## 2022-09-23 DIAGNOSIS — K5730 Diverticulosis of large intestine without perforation or abscess without bleeding: Secondary | ICD-10-CM | POA: Diagnosis not present

## 2022-09-23 DIAGNOSIS — G9389 Other specified disorders of brain: Secondary | ICD-10-CM | POA: Diagnosis not present

## 2022-09-23 DIAGNOSIS — I083 Combined rheumatic disorders of mitral, aortic and tricuspid valves: Secondary | ICD-10-CM | POA: Diagnosis not present

## 2022-09-23 DIAGNOSIS — N179 Acute kidney failure, unspecified: Secondary | ICD-10-CM | POA: Diagnosis not present

## 2022-09-23 DIAGNOSIS — N949 Unspecified condition associated with female genital organs and menstrual cycle: Secondary | ICD-10-CM | POA: Diagnosis not present

## 2022-09-23 DIAGNOSIS — R2243 Localized swelling, mass and lump, lower limb, bilateral: Secondary | ICD-10-CM | POA: Diagnosis not present

## 2022-09-23 DIAGNOSIS — J189 Pneumonia, unspecified organism: Secondary | ICD-10-CM | POA: Diagnosis not present

## 2022-09-23 DIAGNOSIS — R7989 Other specified abnormal findings of blood chemistry: Secondary | ICD-10-CM | POA: Diagnosis not present

## 2022-09-23 DIAGNOSIS — R570 Cardiogenic shock: Secondary | ICD-10-CM | POA: Diagnosis not present

## 2022-09-23 DIAGNOSIS — D649 Anemia, unspecified: Secondary | ICD-10-CM | POA: Diagnosis not present

## 2022-09-23 DIAGNOSIS — J9601 Acute respiratory failure with hypoxia: Secondary | ICD-10-CM | POA: Diagnosis not present

## 2022-09-23 DIAGNOSIS — E8889 Other specified metabolic disorders: Secondary | ICD-10-CM | POA: Diagnosis not present

## 2022-09-23 DIAGNOSIS — R0600 Dyspnea, unspecified: Secondary | ICD-10-CM | POA: Diagnosis not present

## 2022-09-23 DIAGNOSIS — R21 Rash and other nonspecific skin eruption: Secondary | ICD-10-CM | POA: Diagnosis not present

## 2022-09-23 DIAGNOSIS — I272 Pulmonary hypertension, unspecified: Secondary | ICD-10-CM | POA: Diagnosis not present

## 2022-09-23 DIAGNOSIS — J811 Chronic pulmonary edema: Secondary | ICD-10-CM | POA: Diagnosis not present

## 2022-09-23 DIAGNOSIS — I214 Non-ST elevation (NSTEMI) myocardial infarction: Secondary | ICD-10-CM | POA: Diagnosis not present

## 2022-09-23 DIAGNOSIS — K821 Hydrops of gallbladder: Secondary | ICD-10-CM | POA: Diagnosis not present

## 2022-09-23 DIAGNOSIS — G9340 Encephalopathy, unspecified: Secondary | ICD-10-CM | POA: Diagnosis not present

## 2022-09-23 DIAGNOSIS — R7401 Elevation of levels of liver transaminase levels: Secondary | ICD-10-CM | POA: Diagnosis not present

## 2022-09-23 DIAGNOSIS — N390 Urinary tract infection, site not specified: Secondary | ICD-10-CM | POA: Diagnosis not present

## 2022-09-23 DIAGNOSIS — N170 Acute kidney failure with tubular necrosis: Secondary | ICD-10-CM | POA: Diagnosis not present

## 2022-09-23 DIAGNOSIS — I447 Left bundle-branch block, unspecified: Secondary | ICD-10-CM | POA: Diagnosis not present

## 2022-09-23 DIAGNOSIS — K219 Gastro-esophageal reflux disease without esophagitis: Secondary | ICD-10-CM | POA: Diagnosis not present

## 2022-09-23 DIAGNOSIS — E875 Hyperkalemia: Secondary | ICD-10-CM | POA: Diagnosis not present

## 2022-09-23 DIAGNOSIS — R918 Other nonspecific abnormal finding of lung field: Secondary | ICD-10-CM | POA: Diagnosis not present

## 2022-09-23 DIAGNOSIS — G319 Degenerative disease of nervous system, unspecified: Secondary | ICD-10-CM | POA: Diagnosis not present

## 2022-09-23 DIAGNOSIS — I700 Atherosclerosis of aorta: Secondary | ICD-10-CM | POA: Diagnosis not present

## 2022-09-23 DIAGNOSIS — R410 Disorientation, unspecified: Secondary | ICD-10-CM | POA: Diagnosis not present

## 2022-09-23 DIAGNOSIS — F0390 Unspecified dementia without behavioral disturbance: Secondary | ICD-10-CM | POA: Diagnosis not present

## 2022-09-23 DIAGNOSIS — I4892 Unspecified atrial flutter: Secondary | ICD-10-CM | POA: Diagnosis not present

## 2022-09-23 DIAGNOSIS — E871 Hypo-osmolality and hyponatremia: Secondary | ICD-10-CM | POA: Diagnosis not present

## 2022-09-23 DIAGNOSIS — I493 Ventricular premature depolarization: Secondary | ICD-10-CM | POA: Diagnosis not present

## 2022-10-20 DEATH — deceased
